# Patient Record
Sex: Female | Born: 1945 | Race: White | Hispanic: No | Marital: Married | Smoking: Current every day smoker
Health system: Southern US, Community
[De-identification: ages and names within clinical notes are randomized; demographics above are authoritative.]

## PROBLEM LIST (undated history)

## (undated) DIAGNOSIS — T8859XA Other complications of anesthesia, initial encounter: Secondary | ICD-10-CM

## (undated) DIAGNOSIS — E039 Hypothyroidism, unspecified: Secondary | ICD-10-CM

## (undated) DIAGNOSIS — T4145XA Adverse effect of unspecified anesthetic, initial encounter: Secondary | ICD-10-CM

## (undated) DIAGNOSIS — Z889 Allergy status to unspecified drugs, medicaments and biological substances status: Secondary | ICD-10-CM

## (undated) DIAGNOSIS — Z9889 Other specified postprocedural states: Secondary | ICD-10-CM

## (undated) DIAGNOSIS — H269 Unspecified cataract: Secondary | ICD-10-CM

## (undated) DIAGNOSIS — R112 Nausea with vomiting, unspecified: Secondary | ICD-10-CM

## (undated) DIAGNOSIS — D649 Anemia, unspecified: Secondary | ICD-10-CM

## (undated) HISTORY — PX: OTHER SURGICAL HISTORY: SHX169

## (undated) HISTORY — PX: SEPTOPLASTY: SUR1290

## (undated) HISTORY — PX: TUBAL LIGATION: SHX77

---

## 1998-07-28 ENCOUNTER — Other Ambulatory Visit: Admission: RE | Admit: 1998-07-28 | Discharge: 1998-07-28 | Payer: Self-pay | Admitting: *Deleted

## 2004-02-23 ENCOUNTER — Ambulatory Visit (HOSPITAL_BASED_OUTPATIENT_CLINIC_OR_DEPARTMENT_OTHER): Admission: RE | Admit: 2004-02-23 | Discharge: 2004-02-23 | Payer: Self-pay | Admitting: Orthopedic Surgery

## 2004-02-23 ENCOUNTER — Ambulatory Visit (HOSPITAL_COMMUNITY): Admission: RE | Admit: 2004-02-23 | Discharge: 2004-02-23 | Payer: Self-pay | Admitting: Orthopedic Surgery

## 2004-07-31 ENCOUNTER — Encounter (INDEPENDENT_AMBULATORY_CARE_PROVIDER_SITE_OTHER): Payer: Self-pay | Admitting: Specialist

## 2004-07-31 ENCOUNTER — Ambulatory Visit (HOSPITAL_COMMUNITY): Admission: RE | Admit: 2004-07-31 | Discharge: 2004-07-31 | Payer: Self-pay | Admitting: Gastroenterology

## 2004-09-28 ENCOUNTER — Other Ambulatory Visit: Admission: RE | Admit: 2004-09-28 | Discharge: 2004-09-28 | Payer: Self-pay | Admitting: Internal Medicine

## 2006-05-23 ENCOUNTER — Other Ambulatory Visit: Admission: RE | Admit: 2006-05-23 | Discharge: 2006-05-23 | Payer: Self-pay | Admitting: Internal Medicine

## 2006-05-24 ENCOUNTER — Encounter: Admission: RE | Admit: 2006-05-24 | Discharge: 2006-05-24 | Payer: Self-pay | Admitting: Internal Medicine

## 2006-08-24 IMAGING — CR DG CHEST 2V
2 series · 2 of 2 positions shown · non-contrast
Comparison: none

CLINICAL DATA: Cough. 
 TWO VIEW CHEST: 
 No comparison.  Peribronchial cuffing.  Negative for infiltrates, edema, or effusions.  Negative for pulmonary nodules.  Mediastinum and hila are negative for adenopathy.  Heart is normal size.  Degenerate changes mid thoracic spine.

[view not recorded (1 of 2)]
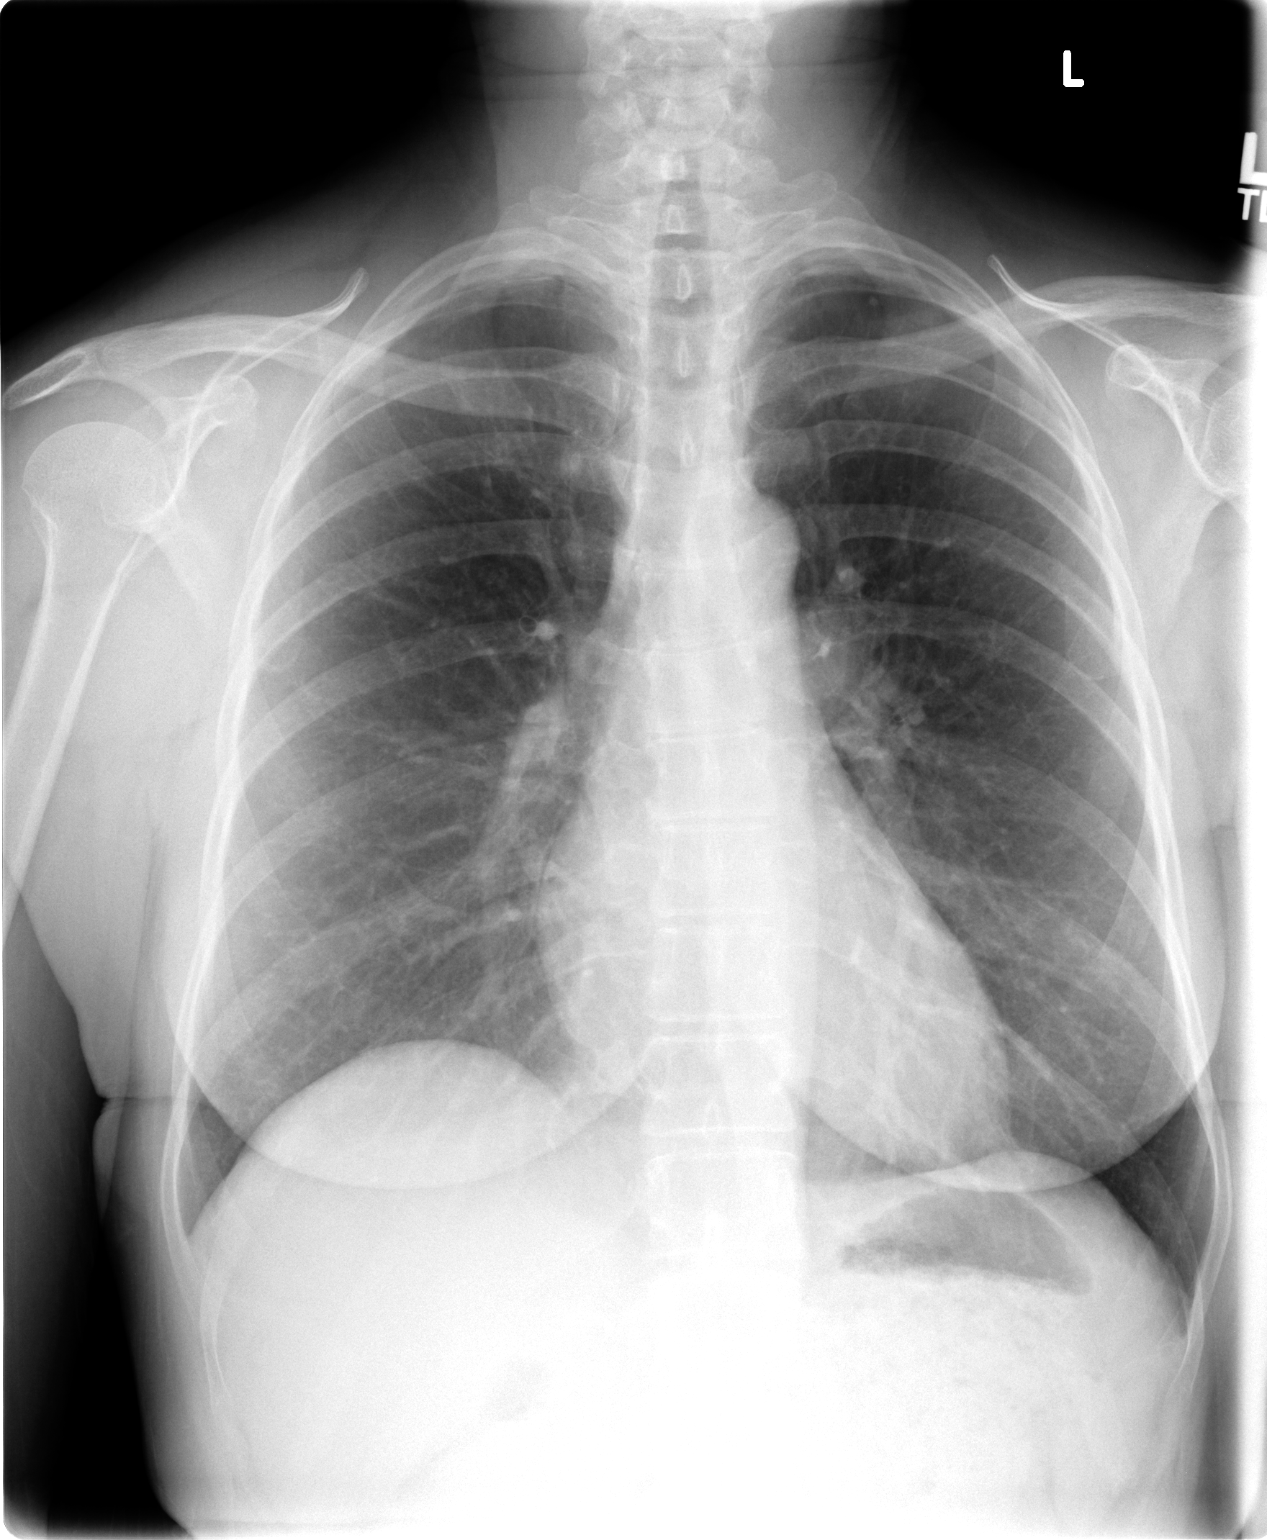

[view not recorded (2 of 2)]
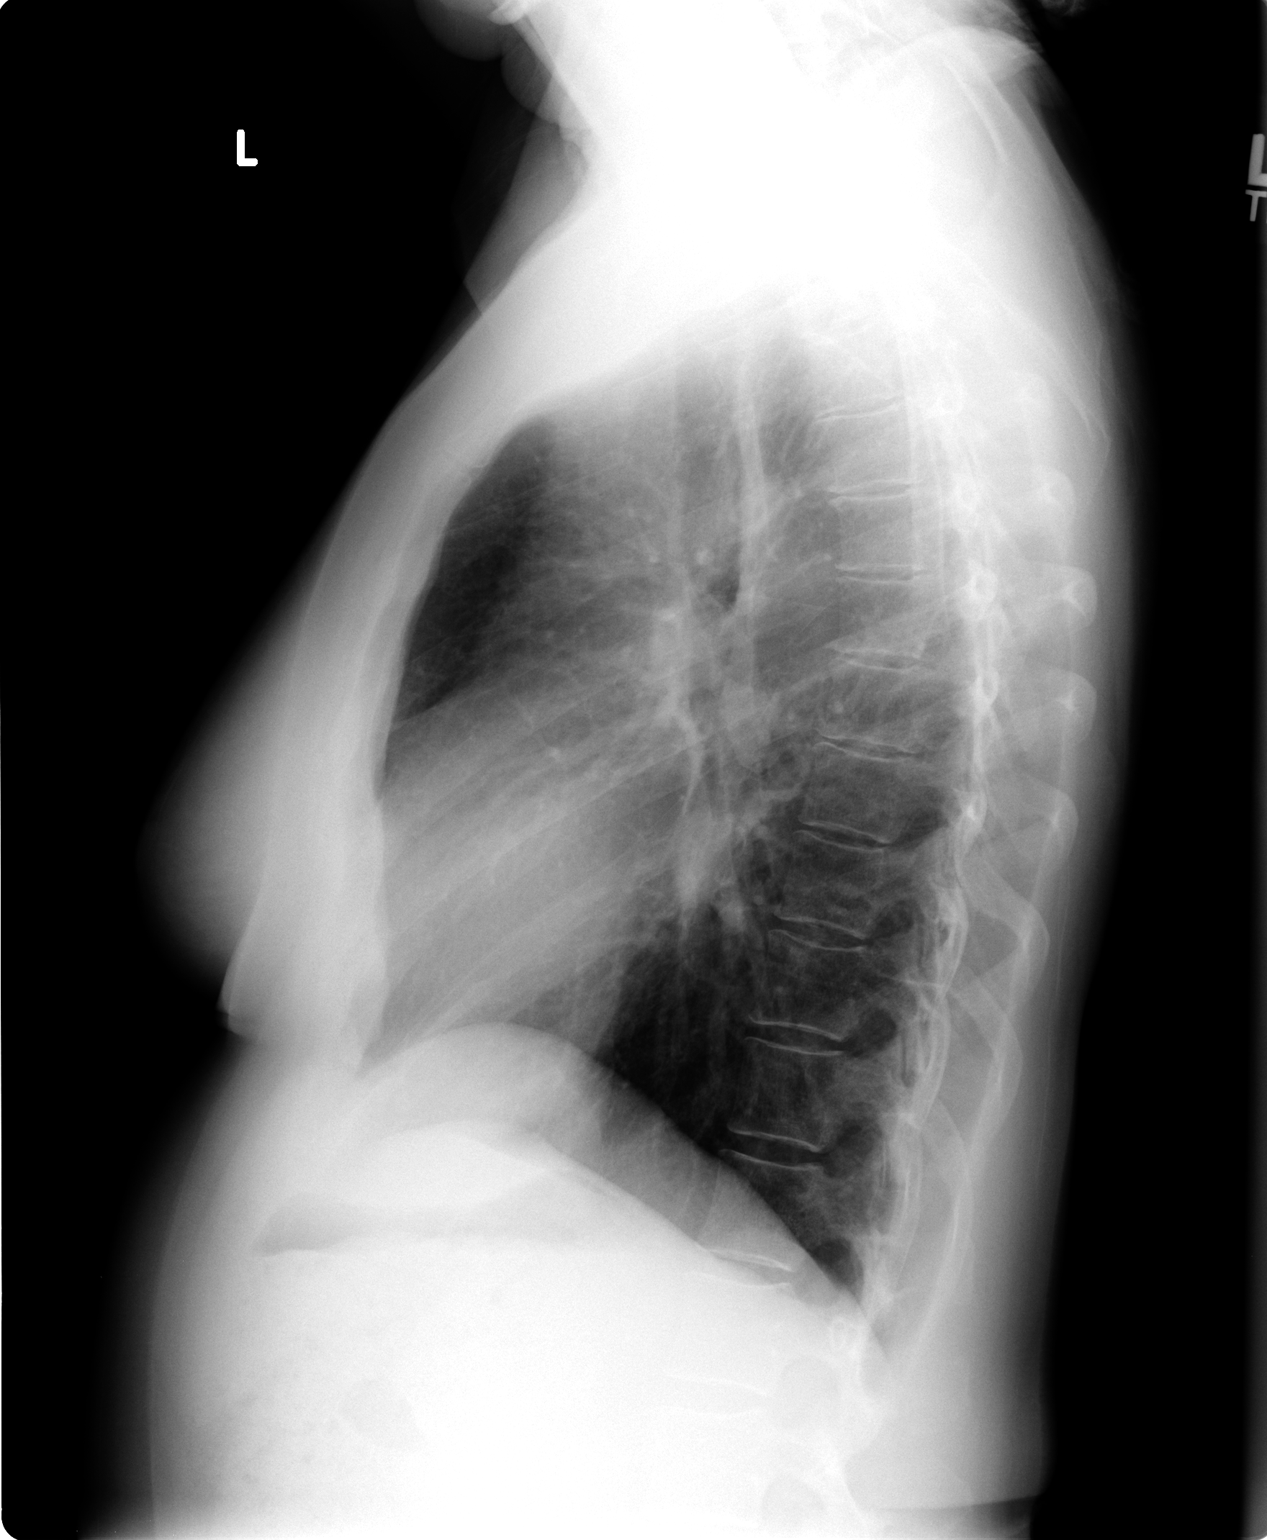

[2 of 2 positions shown; findings below may reference images not displayed]

IMPRESSION: Negative for acute cardiopulmonary process.

## 2007-10-23 ENCOUNTER — Other Ambulatory Visit: Admission: RE | Admit: 2007-10-23 | Discharge: 2007-10-23 | Payer: Self-pay | Admitting: Family Medicine

## 2008-11-04 ENCOUNTER — Other Ambulatory Visit: Admission: RE | Admit: 2008-11-04 | Discharge: 2008-11-04 | Payer: Self-pay | Admitting: Family Medicine

## 2009-11-10 ENCOUNTER — Other Ambulatory Visit: Admission: RE | Admit: 2009-11-10 | Discharge: 2009-11-10 | Payer: Self-pay | Admitting: Family Medicine

## 2011-01-11 ENCOUNTER — Other Ambulatory Visit: Payer: Self-pay | Admitting: Family Medicine

## 2011-01-11 ENCOUNTER — Other Ambulatory Visit (HOSPITAL_COMMUNITY)
Admission: RE | Admit: 2011-01-11 | Discharge: 2011-01-11 | Disposition: A | Discharge status: Home / Self Care | Payer: BC Managed Care – PPO | Source: Ambulatory Visit | Attending: Family Medicine | Admitting: Family Medicine

## 2011-01-11 DIAGNOSIS — Z124 Encounter for screening for malignant neoplasm of cervix: Secondary | ICD-10-CM | POA: Insufficient documentation

## 2011-04-20 NOTE — Op Note (Signed)
Kendra Mitchell, Kendra Mitchell                            ACCOUNT NO.:  [id number]   MEDICAL RECORD NO.:  [id number]                   PATIENT TYPE:  AMB   LOCATION:  DSC                                  FACILITY:  [facility]   PHYSICIAN:  Artist Pais. Mina Marble, M.D.           DATE OF BIRTH:  12-06-1945   DATE OF PROCEDURE:  02/23/2004  DATE OF DISCHARGE:                                 OPERATIVE REPORT   PREOPERATIVE DIAGNOSIS:  Left thumb stenosing tenosynovitis.   POSTOPERATIVE DIAGNOSIS:  Left thumb stenosing tenosynovitis.   PROCEDURE:  Release of left thumb A1 pulley.   SURGEON:  Artist Pais. Mina Marble, M.D.   ASSISTANT:  R.N.   ANESTHESIA:  Monitored anesthesia care with a combination of 2% plain  lidocaine, 0.25% Marcaine digital block 3 mL.   TOURNIQUET TIME:  12 minutes.   COMPLICATIONS:  None.   DRAINS:  None.   DESCRIPTION OF PROCEDURE:  The patient was taken to the operating room after  the induction of IV sedation.  A solution of 0.25% plain Marcaine and 2%  plain lidocaine was injected in the MP flexion crease, 3 mL, left thumb.  The hand was exsanguinated with an Esmarch and the tourniquet was inflated  to 250 mmHg.  At this point in time, a transverse incision was made to the  MP flexion crease 1.5 cm in length.  The incision was taken down through the  skin and subcutaneous tissues.  Neurovascular bundles were identified and  retracted.  The A1 pulley was split with a 15-blade.  The FPL tendon was  lysed of all adhesions.  It was irrigated and loosely closed with 5-0 nylon.  A sterile dressing of Xeroform, 4 x 4's, and Coban wrap was applied.  The  patient tolerated the procedure well and went to the recovery room in a  stable fashion.                                               Artist Pais Mina Marble, M.D.    MAW/MEDQ  D:  02/23/2004  T:  02/23/2004  Job:  [id number]

## 2011-04-20 NOTE — Op Note (Signed)
NAMEJAYLAA, GALLION                      ACCOUNT NO.:  [id number]   MEDICAL RECORD NO.:  [id number]                   PATIENT TYPE:  AMB   LOCATION:  ENDO                                 FACILITY:  [facility]   PHYSICIAN:  Danise Edge, M.D.                DATE OF BIRTH:  07-05-1946   DATE OF PROCEDURE:  07/31/2004  DATE OF DISCHARGE:                                 OPERATIVE REPORT   PROCEDURE:  Screening colonoscopy with polypectomy.   INDICATIONS:  Ms. Shalece Staffa is a 65 year old female born 1946/05/02.  Ms. Dahlem is scheduled to undergo her first screening colonoscopy  with polypectomy to prevent colon cancer.   ENDOSCOPIST:  Danise Edge, M.D.   PREMEDICATION:  Versed 5 mg, Demerol 50 mg.   PROCEDURE:  After obtaining informed consent, Ms. Lottman was placed in the  left lateral decubitus position.  I administered intravenous Demerol and  intravenous Versed to achieve conscious sedation for the procedure.  The  patient's blood pressure, oxygen saturation, and cardiac rhythm were  monitored throughout the procedure and documented in the medical record.   Anal inspection and digital rectal exam was normal.  The Olympus adjustable  pediatric colonoscope was introduced into the rectum and advanced to the  cecum.  A normal-appearing ileocecal valve was intubated, and the distal  ileum inspected.  Colonic preparation for the exam today was excellent.   RECTUM:  Normal.   SIGMOID COLON/DESCENDING COLON:  Left colonic diverticulosis.   SPLENIC FLEXURE:  Normal.   TRANSVERSE COLON:  Normal.   HEPATIC FLEXURE:  A 2 mm sessile polyp was removed with an electrocautery  snare.   ASCENDING COLON:  Normal.   CECUM AND ILEOCECAL VALVE:  From the proximal cecum, a 1 mm sessile polyp  was removed with cold biopsy forceps.   ASSESSMENT:  A diminutive polyp was removed from the cecum and a small polyp  was removed from the hepatic flexure.  Left colonic  diverticulosis present.                                               Danise Edge, M.D.    MJ/MEDQ  D:  07/31/2004  T:  07/31/2004  Job:  [id number]   cc:   Darius Bump, M.D.  [phone] [address]  [place] [address]  Fax: [phone]

## 2012-02-28 ENCOUNTER — Other Ambulatory Visit: Payer: Self-pay | Admitting: Family Medicine

## 2012-02-28 DIAGNOSIS — R0989 Other specified symptoms and signs involving the circulatory and respiratory systems: Secondary | ICD-10-CM

## 2012-03-03 ENCOUNTER — Ambulatory Visit
Admission: RE | Admit: 2012-03-03 | Discharge: 2012-03-03 | Disposition: A | Discharge status: Home / Self Care | Payer: Medicare Other | Source: Ambulatory Visit | Attending: Family Medicine | Admitting: Family Medicine

## 2012-03-03 DIAGNOSIS — R0989 Other specified symptoms and signs involving the circulatory and respiratory systems: Secondary | ICD-10-CM

## 2012-03-03 IMAGING — US US CAROTID DUPLEX BILAT
1 series · 13 of 24 positions shown · non-contrast
Comparison: None.

CLINICAL DATA: Left bruit

BILATERAL CAROTID DUPLEX ULTRASOUND
TECHNIQUE: Gray scale imaging, color Doppler and duplex ultrasound
was performed of bilateral carotid and vertebral arteries in the
neck.

[Series 1: us carotid duplex bilat · 0.07mm/px · 13 of 54 slices shown]
[im 1/54]
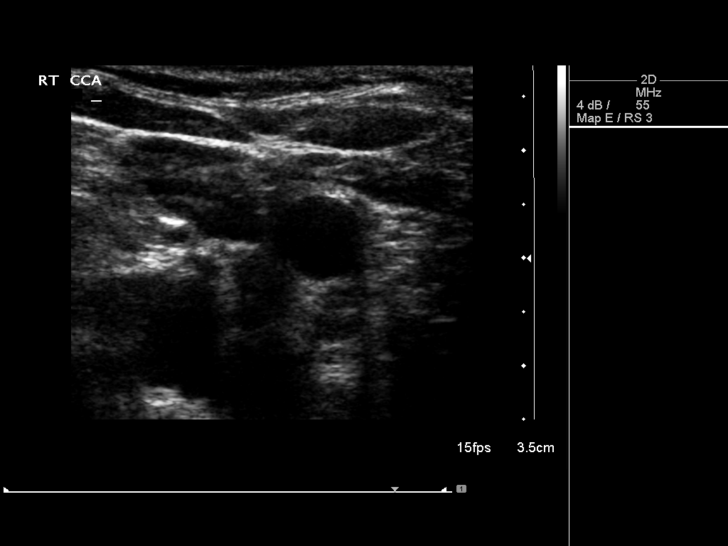
[im 5/54]
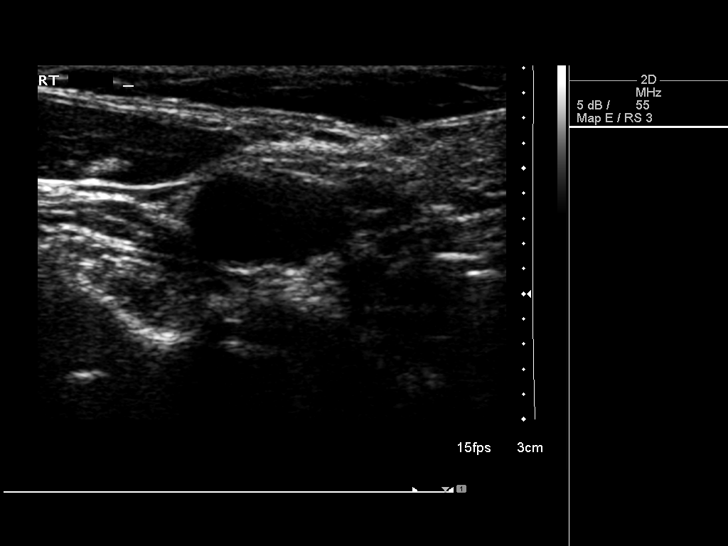
[im 10/54]
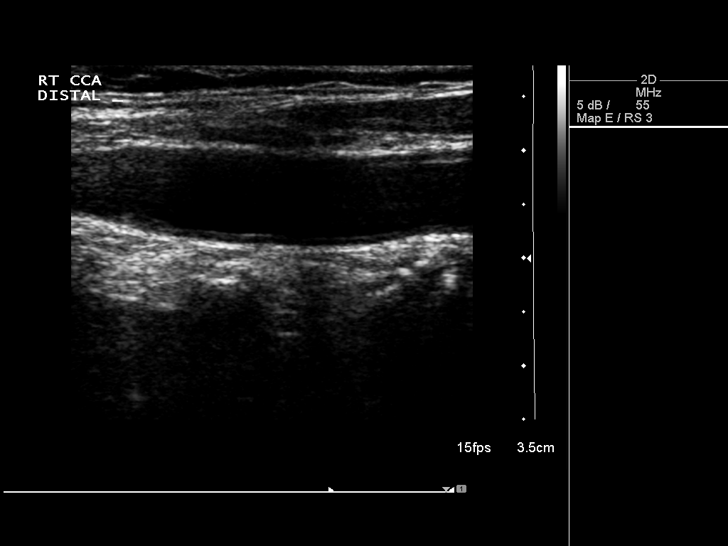
[im 14/54]
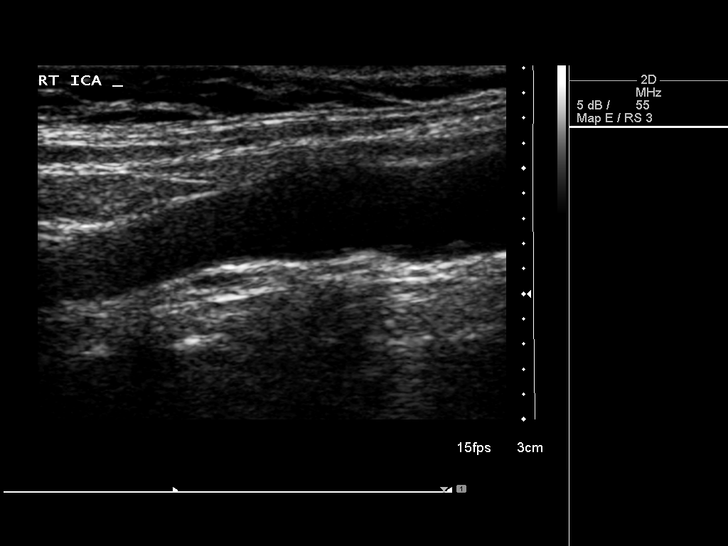
[im 19/54]
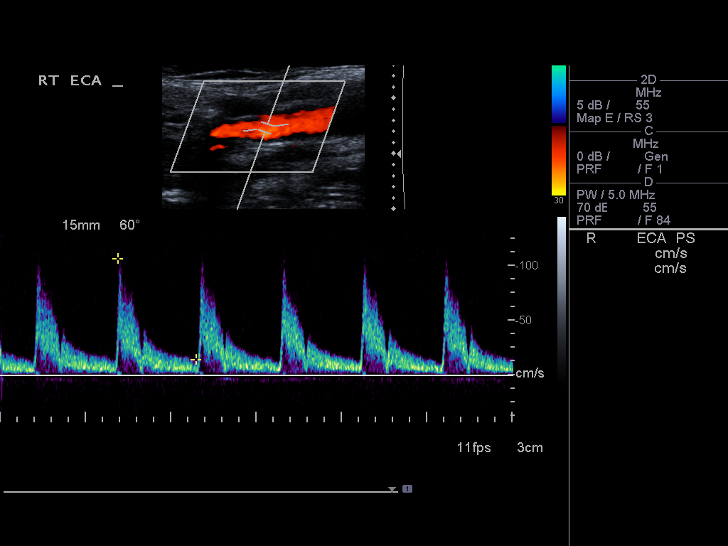
[im 24/54]
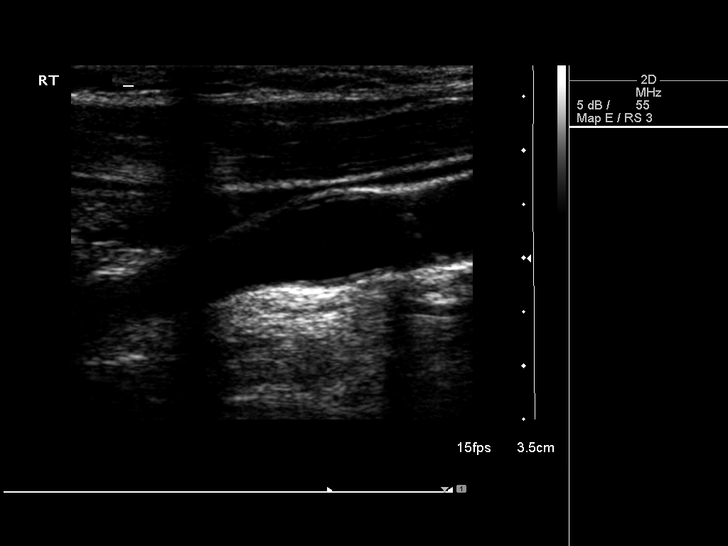
[im 28/54]
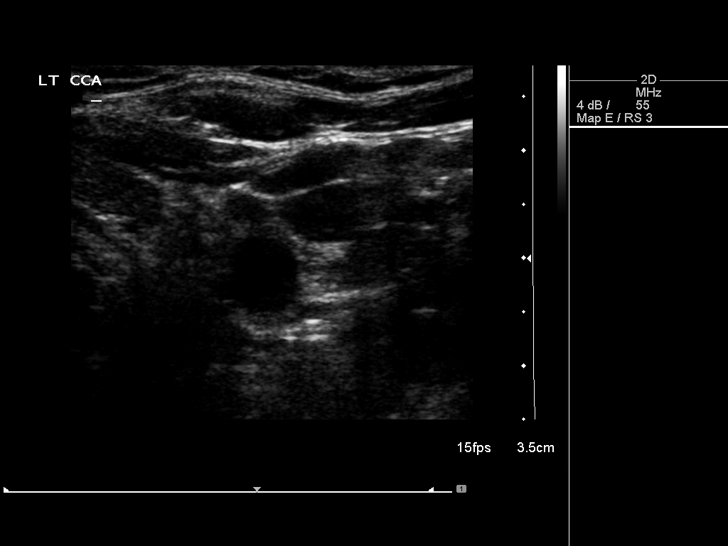
[im 30/54]
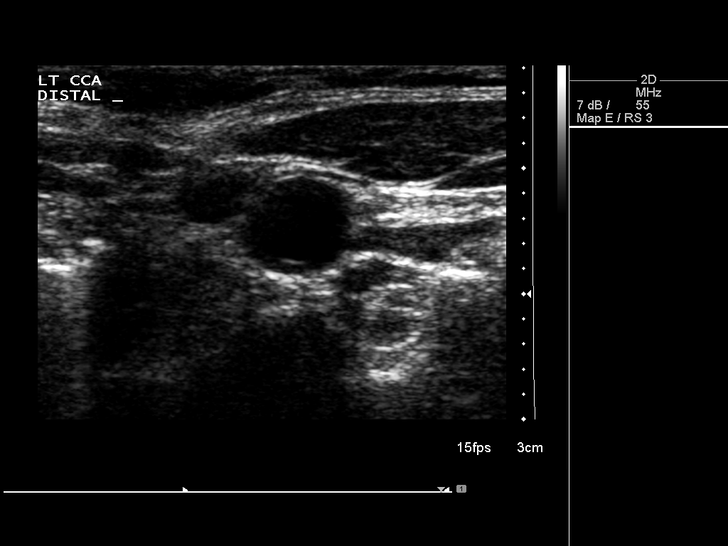
[im 35/54]
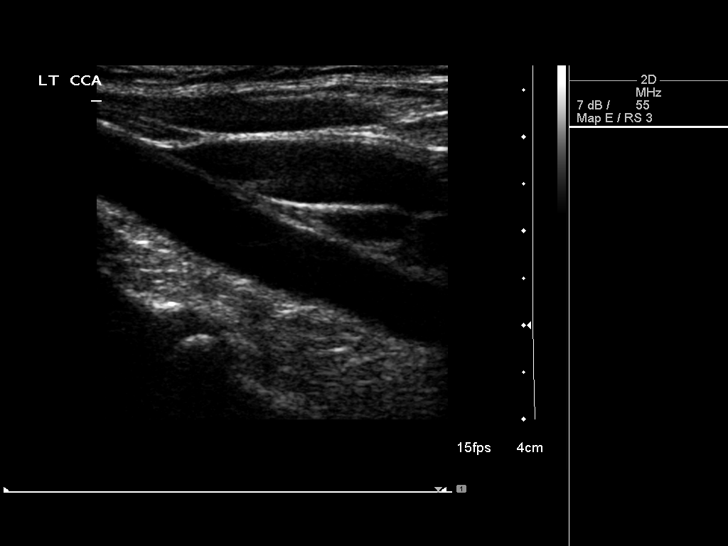
[im 40/54]
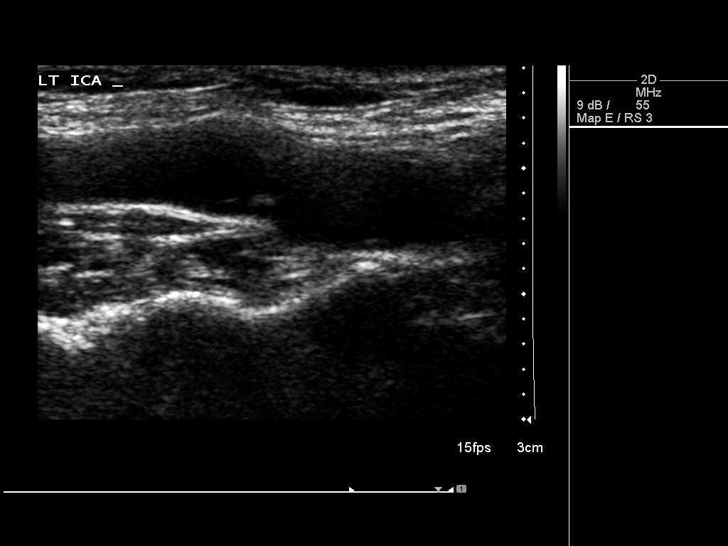
[im 44/54]
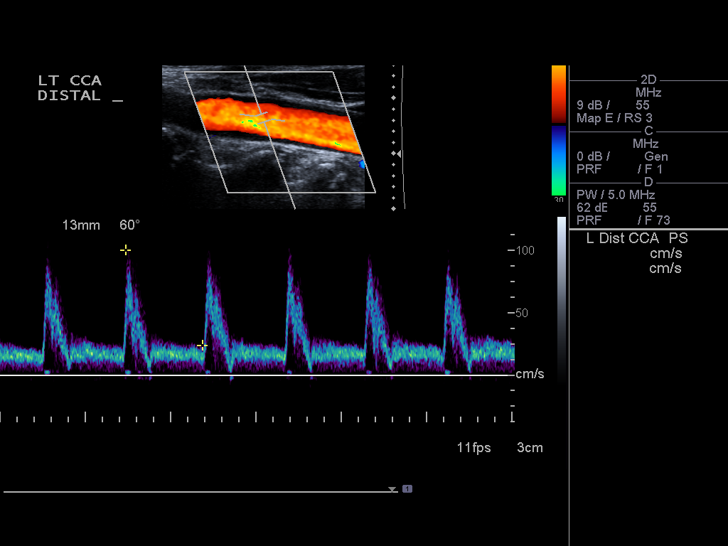
[im 49/54]
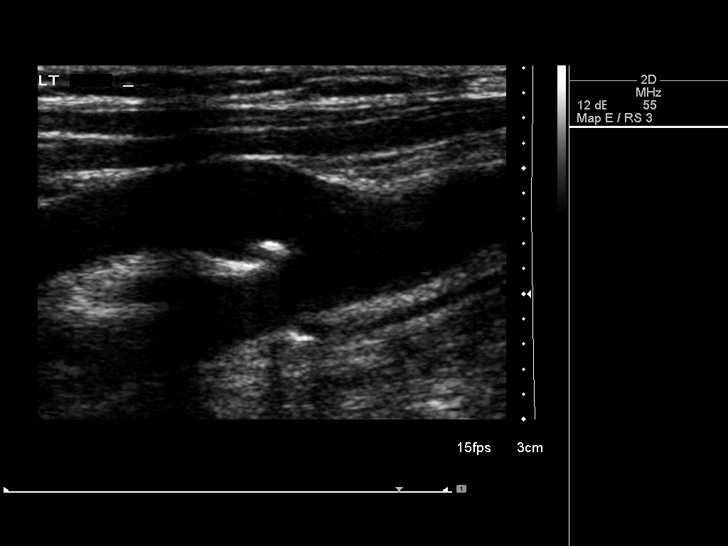
[im 54/54]
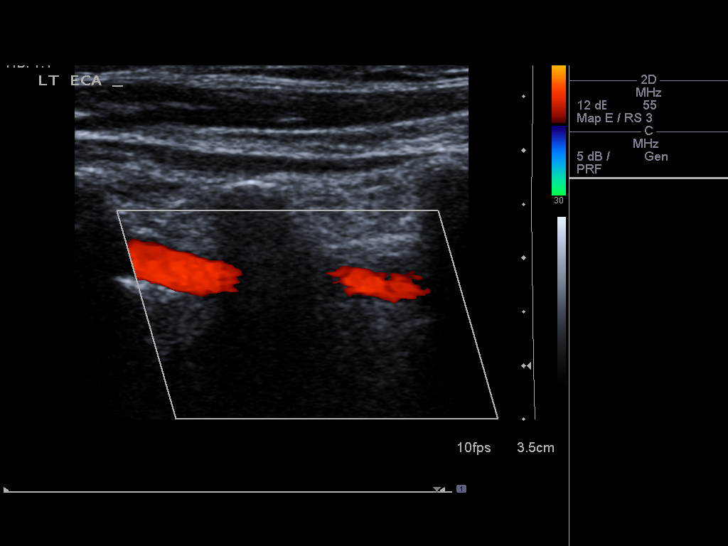

[13 of 24 positions shown; findings below may reference images not displayed]

Criteria:  Quantification of carotid stenosis is based on velocity
parameters that correlate the residual internal carotid diameter
with NASCET-based stenosis levels, using the diameter of the distal
internal carotid lumen as the denominator for stenosis measurement.

The following velocity measurements were obtained:

                 PEAK SYSTOLIC/END DIASTOLIC
RIGHT
ICA:                        91.7cm/sec
CCA:                        100.9cm/sec
SYSTOLIC ICA/CCA RATIO:
DIASTOLIC ICA/CCA RATIO:
ECA:                        106.8cm/sec

LEFT
ICA:                        123.5cm/sec
CCA:                        107.4cm/sec
SYSTOLIC ICA/CCA RATIO:
DIASTOLIC ICA/CCA RATIO:
ECA:                        104.8cm/sec
FINDINGS: RIGHT CAROTID ARTERY: Mild calcified plaque in the bulb.  Normal-
appearing low resistance internal carotid Doppler wave form.

RIGHT VERTEBRAL ARTERY:  Antegrade.

LEFT CAROTID ARTERY: Mild calcified plaque in the bulb.  Low
resistance internal carotid Doppler wave form.

LEFT VERTEBRAL ARTERY:  Antegrade.
IMPRESSION: Less than 50% stenosis in the right and left internal carotid
arteries.  Mild plaque in both bulbs.

## 2013-03-12 ENCOUNTER — Other Ambulatory Visit (HOSPITAL_COMMUNITY)
Admission: RE | Admit: 2013-03-12 | Discharge: 2013-03-12 | Disposition: A | Discharge status: Home / Self Care | Payer: Medicare Other | Source: Ambulatory Visit | Attending: Family Medicine | Admitting: Family Medicine

## 2013-03-12 ENCOUNTER — Other Ambulatory Visit: Payer: Self-pay | Admitting: Family Medicine

## 2013-03-12 DIAGNOSIS — Z124 Encounter for screening for malignant neoplasm of cervix: Secondary | ICD-10-CM | POA: Insufficient documentation

## 2015-09-27 ENCOUNTER — Other Ambulatory Visit: Payer: Self-pay | Admitting: Gastroenterology

## 2015-11-29 ENCOUNTER — Encounter (HOSPITAL_COMMUNITY): Payer: Self-pay | Admitting: *Deleted

## 2015-12-12 ENCOUNTER — Encounter (HOSPITAL_COMMUNITY)
Admission: RE | Disposition: A | Discharge status: Home / Self Care | Payer: Self-pay | Source: Ambulatory Visit | Attending: Gastroenterology

## 2015-12-12 ENCOUNTER — Ambulatory Visit (HOSPITAL_COMMUNITY)
Admission: RE | Admit: 2015-12-12 | Discharge: 2015-12-13 | Disposition: A | Discharge status: Home / Self Care | Payer: Medicare Other | Source: Ambulatory Visit | Attending: Gastroenterology | Admitting: Gastroenterology

## 2015-12-12 ENCOUNTER — Ambulatory Visit (HOSPITAL_COMMUNITY): Payer: Medicare Other | Admitting: Anesthesiology

## 2015-12-12 ENCOUNTER — Encounter (HOSPITAL_COMMUNITY): Payer: Self-pay

## 2015-12-12 DIAGNOSIS — E7800 Pure hypercholesterolemia, unspecified: Secondary | ICD-10-CM | POA: Diagnosis not present

## 2015-12-12 DIAGNOSIS — Z1211 Encounter for screening for malignant neoplasm of colon: Secondary | ICD-10-CM | POA: Insufficient documentation

## 2015-12-12 DIAGNOSIS — Z86010 Personal history of colonic polyps: Secondary | ICD-10-CM | POA: Insufficient documentation

## 2015-12-12 DIAGNOSIS — E039 Hypothyroidism, unspecified: Secondary | ICD-10-CM | POA: Diagnosis not present

## 2015-12-12 HISTORY — DX: Allergy status to unspecified drugs, medicaments and biological substances: Z88.9

## 2015-12-12 HISTORY — DX: Other complications of anesthesia, initial encounter: T88.59XA

## 2015-12-12 HISTORY — DX: Anemia, unspecified: D64.9

## 2015-12-12 HISTORY — DX: Adverse effect of unspecified anesthetic, initial encounter: T41.45XA

## 2015-12-12 HISTORY — PX: COLONOSCOPY WITH PROPOFOL: SHX5780

## 2015-12-12 HISTORY — DX: Nausea with vomiting, unspecified: R11.2

## 2015-12-12 HISTORY — DX: Hypothyroidism, unspecified: E03.9

## 2015-12-12 HISTORY — DX: Unspecified cataract: H26.9

## 2015-12-12 HISTORY — DX: Other specified postprocedural states: Z98.890

## 2015-12-12 SURGERY — COLONOSCOPY WITH PROPOFOL
Anesthesia: Monitor Anesthesia Care

## 2015-12-12 MED ORDER — SODIUM CHLORIDE 0.9 % IV SOLN: INTRAVENOUS | Status: DC

## 2015-12-12 MED ORDER — GLYCOPYRROLATE 0.2 MG/ML IJ SOLN
INTRAMUSCULAR | Status: DC | PRN
  Administered 2015-12-12: 0.2 mg via INTRAVENOUS

## 2015-12-12 MED ORDER — GLYCOPYRROLATE 0.2 MG/ML IJ SOLN
INTRAMUSCULAR | Status: AC
  Filled 2015-12-12: qty 1

## 2015-12-12 MED ORDER — PROPOFOL 500 MG/50ML IV EMUL
INTRAVENOUS | Status: DC | PRN
  Administered 2015-12-12: 50 mg via INTRAVENOUS

## 2015-12-12 MED ORDER — SCOPOLAMINE 1 MG/3DAYS TD PT72
1.0000 | MEDICATED_PATCH | TRANSDERMAL | Status: DC
  Administered 2015-12-12: 1 via TRANSDERMAL
  Filled 2015-12-12: qty 1

## 2015-12-12 MED ORDER — PROPOFOL 500 MG/50ML IV EMUL
INTRAVENOUS | Status: DC | PRN
  Administered 2015-12-12: 120 ug/kg/min via INTRAVENOUS

## 2015-12-12 MED ORDER — LACTATED RINGERS IV SOLN
INTRAVENOUS | Status: DC
  Administered 2015-12-12: 09:00:00 via INTRAVENOUS

## 2015-12-12 MED ORDER — PROPOFOL 10 MG/ML IV BOLUS
INTRAVENOUS | Status: AC
  Filled 2015-12-12: qty 40

## 2015-12-12 MED ORDER — ONDANSETRON HCL 4 MG/2ML IJ SOLN
4.0000 mg | Freq: Once | INTRAMUSCULAR | Status: AC
  Administered 2015-12-12: 4 mg via INTRAVENOUS

## 2015-12-12 MED ORDER — ONDANSETRON HCL 4 MG/2ML IJ SOLN
INTRAMUSCULAR | Status: AC
  Filled 2015-12-12: qty 2

## 2015-12-12 MED ORDER — LIDOCAINE HCL (CARDIAC) 20 MG/ML IV SOLN
INTRAVENOUS | Status: AC
  Filled 2015-12-12: qty 5

## 2015-12-12 SURGICAL SUPPLY — 22 items

## 2015-12-12 NOTE — Anesthesia Postprocedure Evaluation (Signed)
Anesthesia Post Note  Patient: Kendra Mitchell  Procedure(s) Performed: Procedure(s) (LRB): COLONOSCOPY WITH PROPOFOL (N/A)  Patient location during evaluation: PACU Anesthesia Type: MAC Level of consciousness: awake and alert Pain management: pain level controlled Vital Signs Assessment: post-procedure vital signs reviewed and stable Respiratory status: spontaneous breathing, nonlabored ventilation, respiratory function stable and patient connected to nasal cannula oxygen Cardiovascular status: stable and blood pressure returned to baseline Anesthetic complications: no    Last Vitals:  Filed Vitals:   12/12/15 1027 12/12/15 1042  BP: 134/60 164/64  Pulse:  62  Temp:    Resp:  15    Last Pain: There were no vitals filed for this visit.               Jiles GarterJACKSON,Cobin Cadavid EDWARD

## 2015-12-12 NOTE — Transfer of Care (Signed)
Immediate Anesthesia Transfer of Care Note  Patient: Kendra Mitchell  Procedure(s) Performed: Procedure(s): COLONOSCOPY WITH PROPOFOL (N/A)  Patient Location: PACU  Anesthesia Type:MAC  Level of Consciousness: awake, alert  and oriented  Airway & Oxygen Therapy: Patient Spontanous Breathing and Patient connected to face mask oxygen  Post-op Assessment: Report given to RN and Post -op Vital signs reviewed and stable  Post vital signs: Reviewed and stable  Last Vitals:  Filed Vitals:   12/12/15 0843  BP: 143/60  Pulse: 63  Temp: 37.1 C  Resp: 21    Complications: No apparent anesthesia complications

## 2015-12-12 NOTE — Op Note (Signed)
Procedure: Surveillance colonoscopy. One/31/2011 colonoscopy performed with removal of a small adenomatous colon polyp.  Endoscopist: Danise EdgeMartin Dolton Shaker  Premedication: Propofol administered by anesthesia  Procedure: The patient was placed in the left lateral decubitus position. Anal inspection and digital rectal exam were normal. The Pentax pediatric colonoscope was introduced into the rectum and advanced to the cecum. A normal-appearing appendiceal orifice and ileocecal valve were identified. Colonic preparation for the exam today was good. Withdrawal time was 11 minutes  Rectum. Normal. Retroflexed view of the distal rectum was normal  Sigmoid colon and descending colon. Normal  Splenic flexure. Normal  Transverse colon. Normal  Hepatic flexure. Normal  Ascending colon. Normal  Cecum and ileocecal valve. Normal  Assessment: Normal surveillance colonoscopy.  Recommendation: A repeat surveillance colonoscopy is probably not necessary.

## 2015-12-12 NOTE — H&P (Signed)
  Procedure: Surveillance colonoscopy. One/31/2011 colonoscopy performed with removal of a small adenomatous colon polyp  History: The patient is a 70 year old female born May 01, 1946. She is scheduled to undergo a surveillance colonoscopy today.  Past medical history: Osteoporosis of the lumbar spine. Hypercholesterolemia. 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 Hypothyroidism. Tonsillectomy. Bilateral tubal ligation. Deviated septum surgery.  Medication allergies: Sulfa drugs. Codeine.  Family history: Father diagnosed with colon cancer  Exam: The patient is alert and lying comfortably on the endoscopy stretcher. Abdomen is soft and nontender to palpation. Cardiac exam reveals a regular rhythm. Lungs are clear to auscultation.  Plan: Proceed with surveillance colonoscopy

## 2015-12-12 NOTE — Discharge Instructions (Signed)
Colonoscopy, Care After °Refer to this sheet in the next few weeks. These instructions provide you with information on caring for yourself after your procedure. Your health care provider may also give you more specific instructions. Your treatment has been planned according to current medical practices, but problems sometimes occur. Call your health care provider if you have any problems or questions after your procedure. °WHAT TO EXPECT AFTER THE PROCEDURE  °After your procedure, it is typical to have the following: °· A small amount of blood in your stool. °· Moderate amounts of gas and mild abdominal cramping or bloating. °HOME CARE INSTRUCTIONS °· Do not drive, operate machinery, or sign important documents for 24 hours. °· You may shower and resume your regular physical activities, but move at a slower pace for the first 24 hours. °· Take frequent rest periods for the first 24 hours. °· Walk around or put a warm pack on your abdomen to help reduce abdominal cramping and bloating. °· Drink enough fluids to keep your urine clear or pale yellow. °· You may resume your normal diet as instructed by your health care provider. Avoid heavy or fried foods that are hard to digest. °· Avoid drinking alcohol for 24 hours or as instructed by your health care provider. °· Only take over-the-counter or prescription medicines as directed by your health care provider. °· If a tissue sample (biopsy) was taken during your procedure: °¨ Do not take aspirin or blood thinners for 7 days, or as instructed by your health care provider. °¨ Do not drink alcohol for 7 days, or as instructed by your health care provider. °¨ Eat soft foods for the first 24 hours. °SEEK MEDICAL CARE IF: °You have persistent spotting of blood in your stool 2-3 days after the procedure. °SEEK IMMEDIATE MEDICAL CARE IF: °· You have more than a small spotting of blood in your stool. °· You pass large blood clots in your stool. °· Your abdomen is swollen  (distended). °· You have nausea or vomiting. °· You have a fever. °· You have increasing abdominal pain that is not relieved with medicine. °  °This information is not intended to replace advice given to you by your health care provider. Make sure you discuss any questions you have with your health care provider. °  °Document Released: 07/03/2004 Document Revised: 09/09/2013 Document Reviewed: 07/27/2013 °Elsevier Interactive Patient Education ©2016 [employer] ° °

## 2015-12-12 NOTE — Anesthesia Preprocedure Evaluation (Addendum)

## 2015-12-13 ENCOUNTER — Encounter (HOSPITAL_COMMUNITY): Payer: Self-pay | Admitting: Gastroenterology

## 2016-04-16 ENCOUNTER — Other Ambulatory Visit: Payer: Self-pay | Admitting: Family Medicine

## 2016-04-16 DIAGNOSIS — R0989 Other specified symptoms and signs involving the circulatory and respiratory systems: Secondary | ICD-10-CM

## 2016-05-22 ENCOUNTER — Ambulatory Visit
Admission: RE | Admit: 2016-05-22 | Discharge: 2016-05-22 | Disposition: A | Discharge status: Home / Self Care | Payer: Medicare Other | Source: Ambulatory Visit | Attending: Family Medicine | Admitting: Family Medicine

## 2016-05-22 DIAGNOSIS — R0989 Other specified symptoms and signs involving the circulatory and respiratory systems: Secondary | ICD-10-CM

## 2016-05-22 IMAGING — US US CAROTID DUPLEX BILAT
1 series · 13 of 24 positions shown · non-contrast
Comparison: [DATE]

CLINICAL DATA: 69-year-old female with a history of carotid bruit.

Cardiovascular risk factors include hyperlipidemia, tobacco use
EXAM:
BILATERAL CAROTID DUPLEX ULTRASOUND
TECHNIQUE: Gray scale imaging, color Doppler and duplex ultrasound were
performed of bilateral carotid and vertebral arteries in the neck.

[Series 1: us carotid duplex bilat · 0.08mm/px · 13 of 68 slices shown]
[im 1/68]
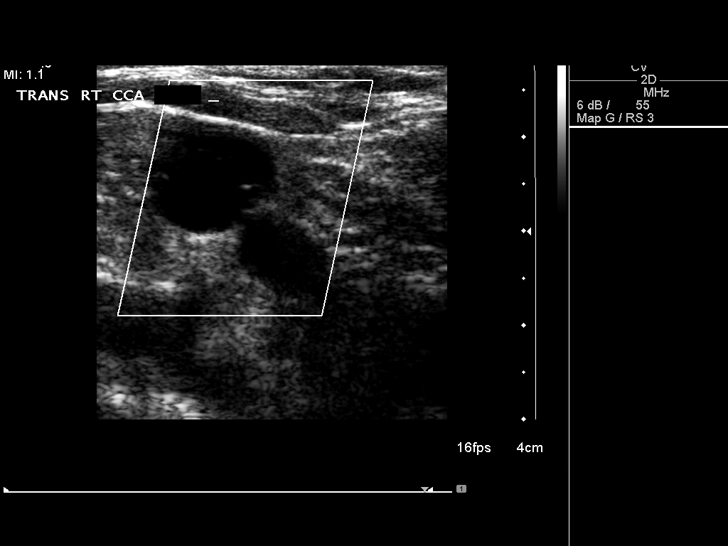
[im 6/68]
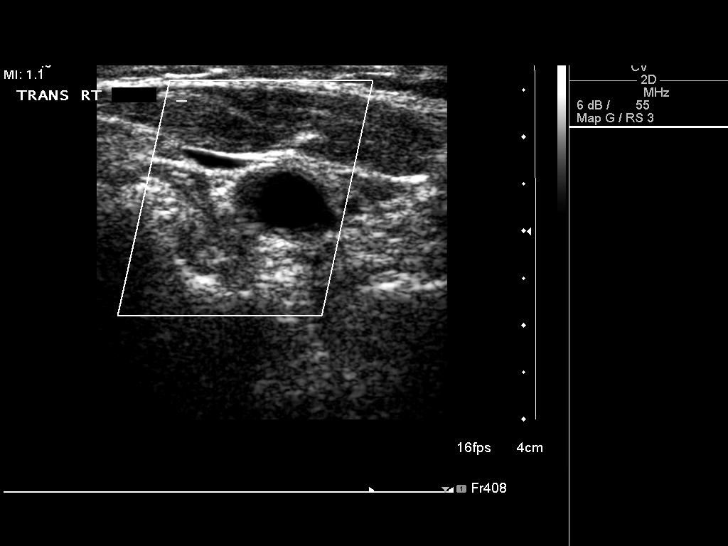
[im 12/68]
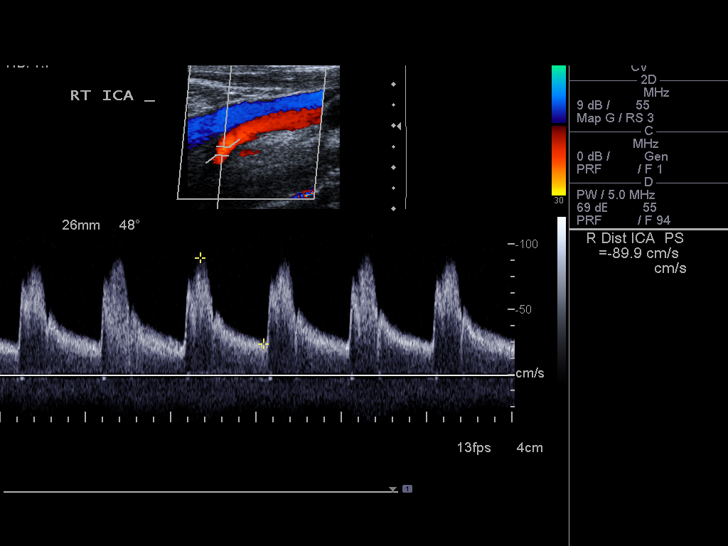
[im 18/68]
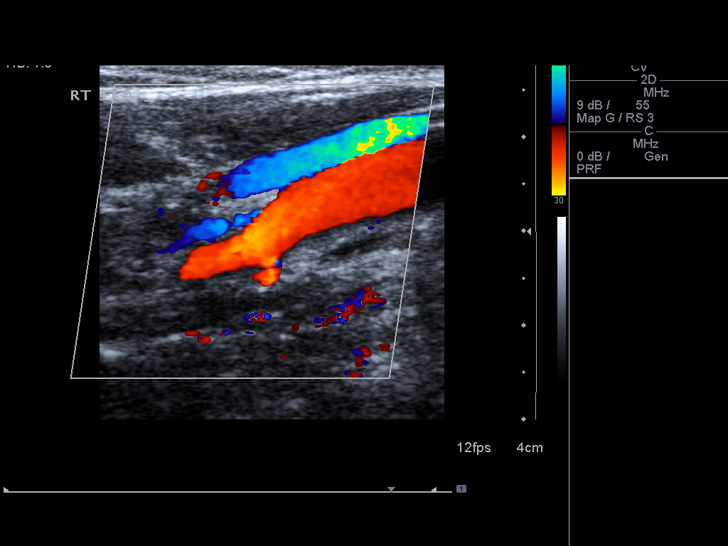
[im 24/68]
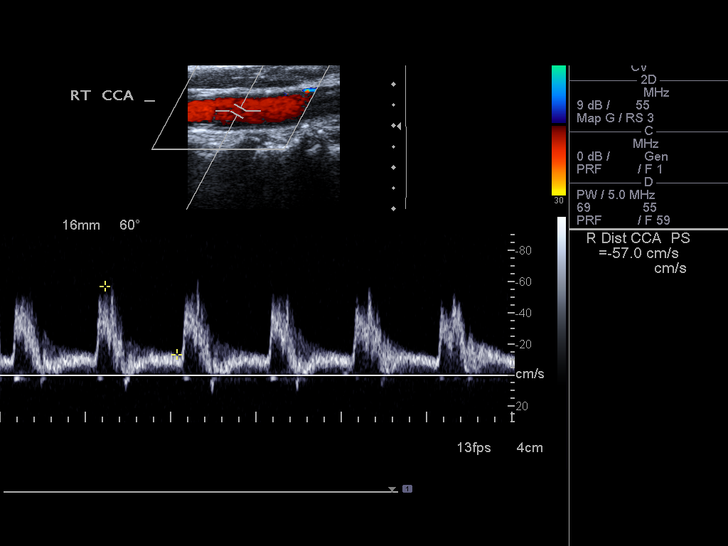
[im 30/68]
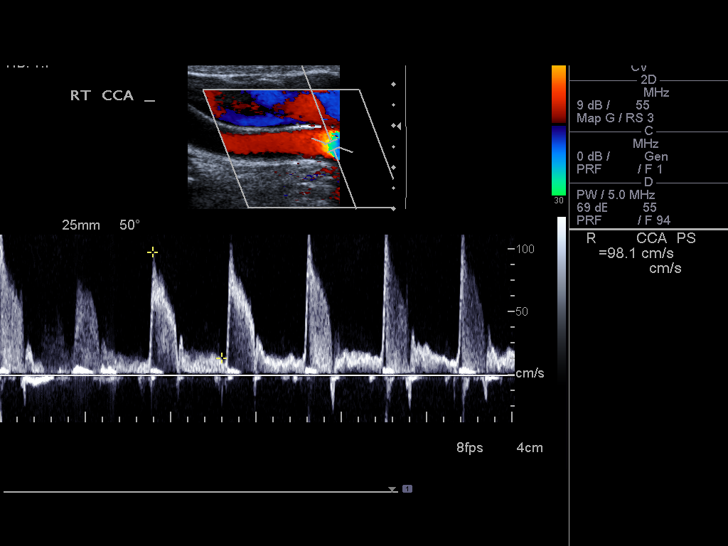
[im 35/68]
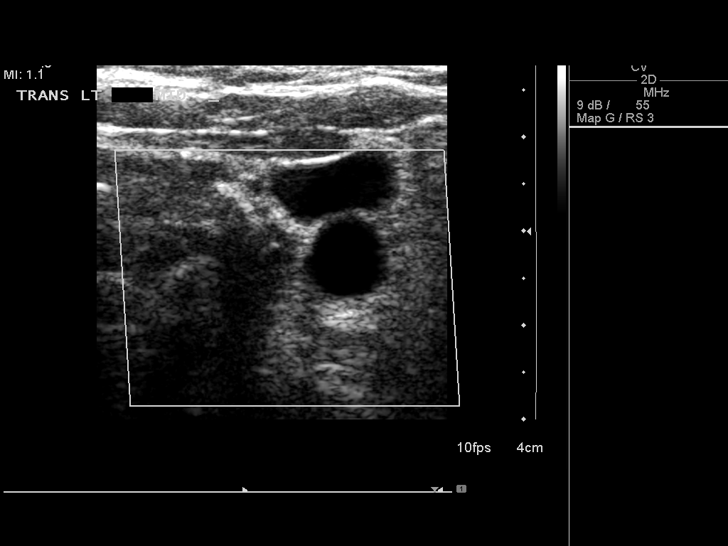
[im 38/68]
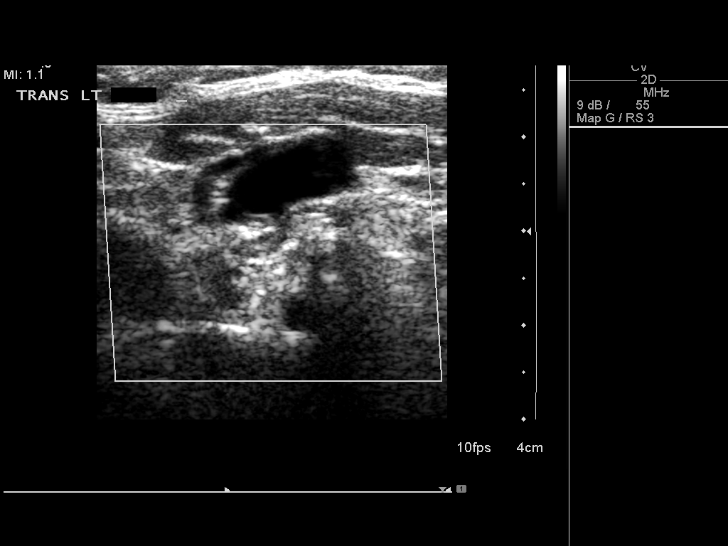
[im 44/68]
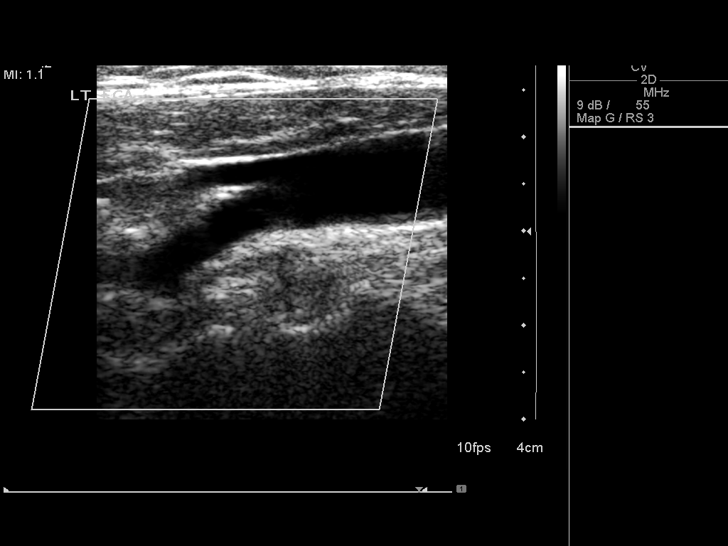
[im 50/68]
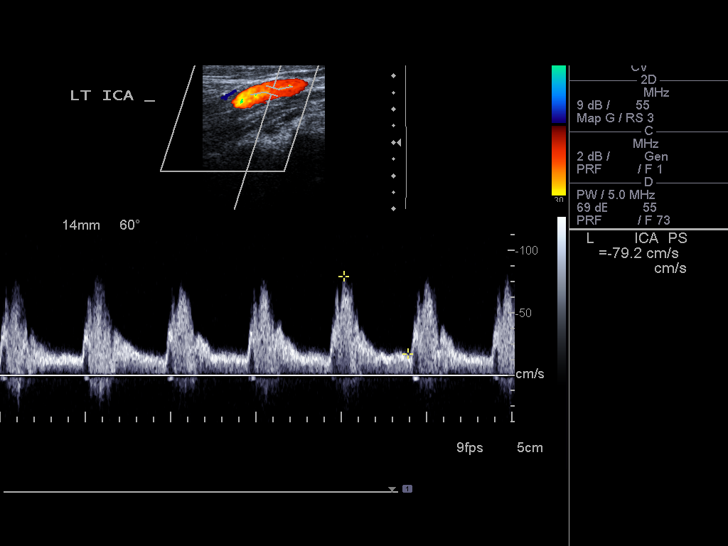
[im 56/68]
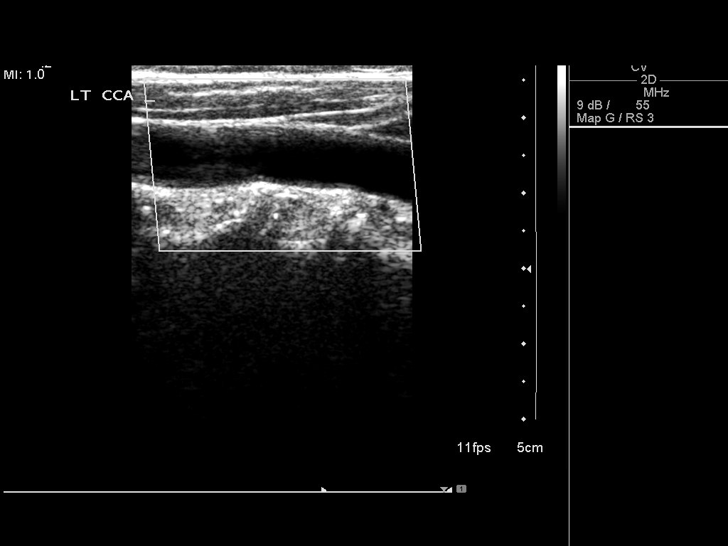
[im 62/68]
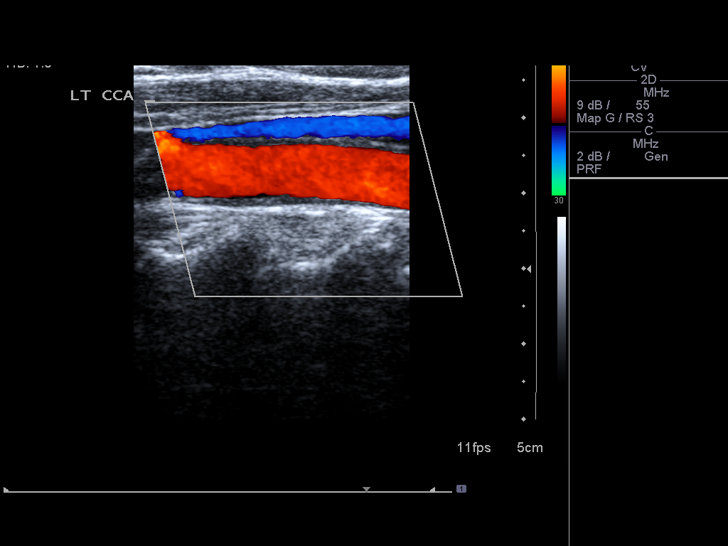
[im 68/68]
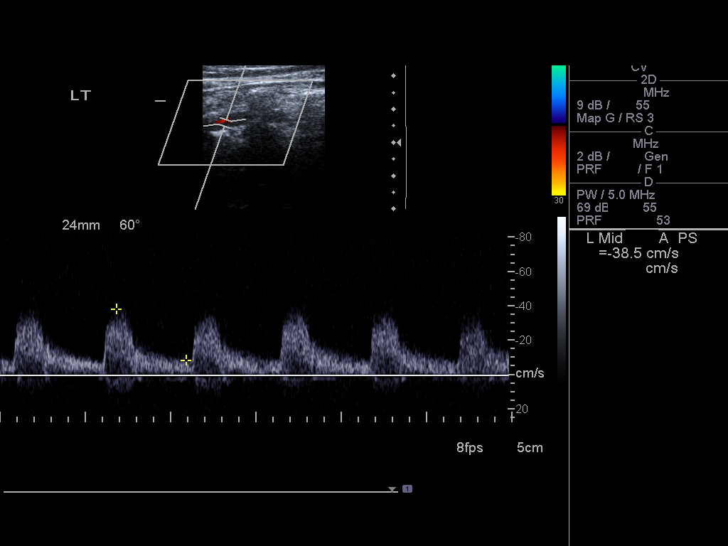

[13 of 24 positions shown; findings below may reference images not displayed]

FINDINGS: Criteria: Quantification of carotid stenosis is based on velocity
parameters that correlate the residual internal carotid diameter
with NASCET-based stenosis levels, using the diameter of the distal
internal carotid lumen as the denominator for stenosis measurement.

The following velocity measurements were obtained:

RIGHT

ICA:  Systolic 90 cm/sec, Diastolic 24 cm/sec

CCA:  98 cm/sec

SYSTOLIC ICA/CCA RATIO:

ECA:  69 cm/sec

LEFT

ICA:  Systolic 87 cm/sec, Diastolic 27 cm/sec

CCA:  114 cm/sec

SYSTOLIC ICA/CCA RATIO:

ECA:  101 cm/sec

Right Brachial SBP: 154

Left Brachial SBP: 149

RIGHT CAROTID ARTERY: No significant calcified disease of the right
common carotid artery. Intermediate waveform maintained.
Heterogeneous plaque without significant calcifications at the right
carotid bifurcation. Low resistance waveform of the right ICA. No
significant tortuosity.

RIGHT VERTEBRAL ARTERY: Antegrade flow with low resistance waveform.

LEFT CAROTID ARTERY: No significant calcified disease of the left
common carotid artery. Intermediate waveform maintained.
Heterogeneous plaque at the left carotid bifurcation without
significant calcifications. Low resistance waveform of the left ICA.

LEFT VERTEBRAL ARTERY:  Antegrade flow with low resistance waveform.
IMPRESSION: Color duplex indicates minimal heterogeneous plaque, with no
hemodynamically significant stenosis by duplex criteria in the
extracranial cerebrovascular circulation.

## 2017-02-26 DIAGNOSIS — Z1231 Encounter for screening mammogram for malignant neoplasm of breast: Secondary | ICD-10-CM | POA: Diagnosis not present

## 2017-04-18 DIAGNOSIS — E039 Hypothyroidism, unspecified: Secondary | ICD-10-CM | POA: Diagnosis not present

## 2017-04-18 DIAGNOSIS — R7301 Impaired fasting glucose: Secondary | ICD-10-CM | POA: Diagnosis not present

## 2017-04-18 DIAGNOSIS — E559 Vitamin D deficiency, unspecified: Secondary | ICD-10-CM | POA: Diagnosis not present

## 2017-04-18 DIAGNOSIS — E7800 Pure hypercholesterolemia, unspecified: Secondary | ICD-10-CM | POA: Diagnosis not present

## 2017-04-22 DIAGNOSIS — R0989 Other specified symptoms and signs involving the circulatory and respiratory systems: Secondary | ICD-10-CM | POA: Diagnosis not present

## 2017-04-22 DIAGNOSIS — R7301 Impaired fasting glucose: Secondary | ICD-10-CM | POA: Diagnosis not present

## 2017-04-22 DIAGNOSIS — E559 Vitamin D deficiency, unspecified: Secondary | ICD-10-CM | POA: Diagnosis not present

## 2017-04-22 DIAGNOSIS — Z0000 Encounter for general adult medical examination without abnormal findings: Secondary | ICD-10-CM | POA: Diagnosis not present

## 2017-04-22 DIAGNOSIS — M810 Age-related osteoporosis without current pathological fracture: Secondary | ICD-10-CM | POA: Diagnosis not present

## 2017-04-22 DIAGNOSIS — E7800 Pure hypercholesterolemia, unspecified: Secondary | ICD-10-CM | POA: Diagnosis not present

## 2017-04-22 DIAGNOSIS — N898 Other specified noninflammatory disorders of vagina: Secondary | ICD-10-CM | POA: Diagnosis not present

## 2017-04-22 DIAGNOSIS — E039 Hypothyroidism, unspecified: Secondary | ICD-10-CM | POA: Diagnosis not present

## 2017-04-22 DIAGNOSIS — Z1389 Encounter for screening for other disorder: Secondary | ICD-10-CM | POA: Diagnosis not present

## 2017-04-22 DIAGNOSIS — Z86010 Personal history of colonic polyps: Secondary | ICD-10-CM | POA: Diagnosis not present

## 2017-11-01 DIAGNOSIS — R7301 Impaired fasting glucose: Secondary | ICD-10-CM | POA: Diagnosis not present

## 2017-11-01 DIAGNOSIS — E559 Vitamin D deficiency, unspecified: Secondary | ICD-10-CM | POA: Diagnosis not present

## 2017-11-01 DIAGNOSIS — E7800 Pure hypercholesterolemia, unspecified: Secondary | ICD-10-CM | POA: Diagnosis not present

## 2017-11-01 DIAGNOSIS — E039 Hypothyroidism, unspecified: Secondary | ICD-10-CM | POA: Diagnosis not present

## 2017-11-04 DIAGNOSIS — E7800 Pure hypercholesterolemia, unspecified: Secondary | ICD-10-CM | POA: Diagnosis not present

## 2017-11-04 DIAGNOSIS — E559 Vitamin D deficiency, unspecified: Secondary | ICD-10-CM | POA: Diagnosis not present

## 2017-11-04 DIAGNOSIS — Z720 Tobacco use: Secondary | ICD-10-CM | POA: Diagnosis not present

## 2017-11-04 DIAGNOSIS — E039 Hypothyroidism, unspecified: Secondary | ICD-10-CM | POA: Diagnosis not present

## 2017-11-04 DIAGNOSIS — R7301 Impaired fasting glucose: Secondary | ICD-10-CM | POA: Diagnosis not present

## 2017-12-18 DIAGNOSIS — E7800 Pure hypercholesterolemia, unspecified: Secondary | ICD-10-CM | POA: Diagnosis not present

## 2017-12-18 DIAGNOSIS — R7301 Impaired fasting glucose: Secondary | ICD-10-CM | POA: Diagnosis not present

## 2017-12-18 DIAGNOSIS — E039 Hypothyroidism, unspecified: Secondary | ICD-10-CM | POA: Diagnosis not present

## 2017-12-18 DIAGNOSIS — E559 Vitamin D deficiency, unspecified: Secondary | ICD-10-CM | POA: Diagnosis not present

## 2018-02-28 DIAGNOSIS — M1712 Unilateral primary osteoarthritis, left knee: Secondary | ICD-10-CM | POA: Diagnosis not present

## 2018-02-28 DIAGNOSIS — M1711 Unilateral primary osteoarthritis, right knee: Secondary | ICD-10-CM | POA: Diagnosis not present

## 2018-02-28 DIAGNOSIS — M25562 Pain in left knee: Secondary | ICD-10-CM | POA: Diagnosis not present

## 2018-03-03 DIAGNOSIS — M8589 Other specified disorders of bone density and structure, multiple sites: Secondary | ICD-10-CM | POA: Diagnosis not present

## 2018-03-03 DIAGNOSIS — Z1231 Encounter for screening mammogram for malignant neoplasm of breast: Secondary | ICD-10-CM | POA: Diagnosis not present

## 2018-04-29 DIAGNOSIS — M25562 Pain in left knee: Secondary | ICD-10-CM | POA: Diagnosis not present

## 2018-05-06 DIAGNOSIS — Z1159 Encounter for screening for other viral diseases: Secondary | ICD-10-CM | POA: Diagnosis not present

## 2018-05-06 DIAGNOSIS — E559 Vitamin D deficiency, unspecified: Secondary | ICD-10-CM | POA: Diagnosis not present

## 2018-05-06 DIAGNOSIS — R0989 Other specified symptoms and signs involving the circulatory and respiratory systems: Secondary | ICD-10-CM | POA: Diagnosis not present

## 2018-05-06 DIAGNOSIS — Z86010 Personal history of colonic polyps: Secondary | ICD-10-CM | POA: Diagnosis not present

## 2018-05-06 DIAGNOSIS — E7800 Pure hypercholesterolemia, unspecified: Secondary | ICD-10-CM | POA: Diagnosis not present

## 2018-05-06 DIAGNOSIS — R7301 Impaired fasting glucose: Secondary | ICD-10-CM | POA: Diagnosis not present

## 2018-05-06 DIAGNOSIS — E039 Hypothyroidism, unspecified: Secondary | ICD-10-CM | POA: Diagnosis not present

## 2018-05-06 DIAGNOSIS — Z1389 Encounter for screening for other disorder: Secondary | ICD-10-CM | POA: Diagnosis not present

## 2018-05-06 DIAGNOSIS — Z0000 Encounter for general adult medical examination without abnormal findings: Secondary | ICD-10-CM | POA: Diagnosis not present

## 2018-05-06 DIAGNOSIS — M810 Age-related osteoporosis without current pathological fracture: Secondary | ICD-10-CM | POA: Diagnosis not present

## 2018-05-07 DIAGNOSIS — Z136 Encounter for screening for cardiovascular disorders: Secondary | ICD-10-CM | POA: Diagnosis not present

## 2018-05-07 DIAGNOSIS — E7800 Pure hypercholesterolemia, unspecified: Secondary | ICD-10-CM | POA: Diagnosis not present

## 2018-05-07 DIAGNOSIS — Z86010 Personal history of colonic polyps: Secondary | ICD-10-CM | POA: Diagnosis not present

## 2018-05-07 DIAGNOSIS — R0989 Other specified symptoms and signs involving the circulatory and respiratory systems: Secondary | ICD-10-CM | POA: Diagnosis not present

## 2018-05-07 DIAGNOSIS — E559 Vitamin D deficiency, unspecified: Secondary | ICD-10-CM | POA: Diagnosis not present

## 2018-05-07 DIAGNOSIS — E039 Hypothyroidism, unspecified: Secondary | ICD-10-CM | POA: Diagnosis not present

## 2018-05-07 DIAGNOSIS — R7301 Impaired fasting glucose: Secondary | ICD-10-CM | POA: Diagnosis not present

## 2018-05-07 DIAGNOSIS — M810 Age-related osteoporosis without current pathological fracture: Secondary | ICD-10-CM | POA: Diagnosis not present

## 2018-05-07 DIAGNOSIS — Z1159 Encounter for screening for other viral diseases: Secondary | ICD-10-CM | POA: Diagnosis not present

## 2018-05-07 DIAGNOSIS — Z0000 Encounter for general adult medical examination without abnormal findings: Secondary | ICD-10-CM | POA: Diagnosis not present

## 2018-11-18 DIAGNOSIS — F419 Anxiety disorder, unspecified: Secondary | ICD-10-CM | POA: Diagnosis not present

## 2018-11-18 DIAGNOSIS — E559 Vitamin D deficiency, unspecified: Secondary | ICD-10-CM | POA: Diagnosis not present

## 2018-11-18 DIAGNOSIS — E7800 Pure hypercholesterolemia, unspecified: Secondary | ICD-10-CM | POA: Diagnosis not present

## 2018-11-18 DIAGNOSIS — M79604 Pain in right leg: Secondary | ICD-10-CM | POA: Diagnosis not present

## 2018-11-18 DIAGNOSIS — E039 Hypothyroidism, unspecified: Secondary | ICD-10-CM | POA: Diagnosis not present

## 2018-11-18 DIAGNOSIS — R7301 Impaired fasting glucose: Secondary | ICD-10-CM | POA: Diagnosis not present

## 2019-05-19 DIAGNOSIS — E039 Hypothyroidism, unspecified: Secondary | ICD-10-CM | POA: Diagnosis not present

## 2019-05-19 DIAGNOSIS — E559 Vitamin D deficiency, unspecified: Secondary | ICD-10-CM | POA: Diagnosis not present

## 2019-05-19 DIAGNOSIS — R7301 Impaired fasting glucose: Secondary | ICD-10-CM | POA: Diagnosis not present

## 2019-05-19 DIAGNOSIS — E7800 Pure hypercholesterolemia, unspecified: Secondary | ICD-10-CM | POA: Diagnosis not present

## 2019-05-19 DIAGNOSIS — M810 Age-related osteoporosis without current pathological fracture: Secondary | ICD-10-CM | POA: Diagnosis not present

## 2019-11-20 DIAGNOSIS — E039 Hypothyroidism, unspecified: Secondary | ICD-10-CM | POA: Diagnosis not present

## 2019-11-20 DIAGNOSIS — R7301 Impaired fasting glucose: Secondary | ICD-10-CM | POA: Diagnosis not present

## 2019-11-20 DIAGNOSIS — E559 Vitamin D deficiency, unspecified: Secondary | ICD-10-CM | POA: Diagnosis not present

## 2019-11-20 DIAGNOSIS — E7800 Pure hypercholesterolemia, unspecified: Secondary | ICD-10-CM | POA: Diagnosis not present

## 2019-11-20 DIAGNOSIS — M810 Age-related osteoporosis without current pathological fracture: Secondary | ICD-10-CM | POA: Diagnosis not present

## 2019-11-30 DIAGNOSIS — M810 Age-related osteoporosis without current pathological fracture: Secondary | ICD-10-CM | POA: Diagnosis not present

## 2019-11-30 DIAGNOSIS — M79673 Pain in unspecified foot: Secondary | ICD-10-CM | POA: Diagnosis not present

## 2019-11-30 DIAGNOSIS — E559 Vitamin D deficiency, unspecified: Secondary | ICD-10-CM | POA: Diagnosis not present

## 2019-11-30 DIAGNOSIS — E039 Hypothyroidism, unspecified: Secondary | ICD-10-CM | POA: Diagnosis not present

## 2019-11-30 DIAGNOSIS — E7800 Pure hypercholesterolemia, unspecified: Secondary | ICD-10-CM | POA: Diagnosis not present

## 2019-11-30 DIAGNOSIS — R7301 Impaired fasting glucose: Secondary | ICD-10-CM | POA: Diagnosis not present

## 2019-11-30 DIAGNOSIS — F419 Anxiety disorder, unspecified: Secondary | ICD-10-CM | POA: Diagnosis not present

## 2020-01-19 DIAGNOSIS — Z20828 Contact with and (suspected) exposure to other viral communicable diseases: Secondary | ICD-10-CM | POA: Diagnosis not present

## 2020-01-24 DIAGNOSIS — Z20828 Contact with and (suspected) exposure to other viral communicable diseases: Secondary | ICD-10-CM | POA: Diagnosis not present

## 2020-02-15 DIAGNOSIS — M79671 Pain in right foot: Secondary | ICD-10-CM | POA: Diagnosis not present

## 2020-02-24 DIAGNOSIS — L538 Other specified erythematous conditions: Secondary | ICD-10-CM | POA: Diagnosis not present

## 2020-02-24 DIAGNOSIS — L570 Actinic keratosis: Secondary | ICD-10-CM | POA: Diagnosis not present

## 2020-02-24 DIAGNOSIS — L821 Other seborrheic keratosis: Secondary | ICD-10-CM | POA: Diagnosis not present

## 2020-02-24 DIAGNOSIS — L814 Other melanin hyperpigmentation: Secondary | ICD-10-CM | POA: Diagnosis not present

## 2020-03-02 ENCOUNTER — Other Ambulatory Visit: Payer: Self-pay

## 2020-03-02 ENCOUNTER — Ambulatory Visit (INDEPENDENT_AMBULATORY_CARE_PROVIDER_SITE_OTHER): Payer: PPO

## 2020-03-02 ENCOUNTER — Ambulatory Visit: Payer: PPO | Admitting: Podiatry

## 2020-03-02 ENCOUNTER — Other Ambulatory Visit: Payer: Self-pay | Admitting: Podiatry

## 2020-03-02 ENCOUNTER — Encounter: Payer: Self-pay | Admitting: Podiatry

## 2020-03-02 VITALS — BP 148/75 | HR 81 | Temp 98.1°F | Resp 16

## 2020-03-02 DIAGNOSIS — M79671 Pain in right foot: Secondary | ICD-10-CM | POA: Diagnosis not present

## 2020-03-02 DIAGNOSIS — M79672 Pain in left foot: Secondary | ICD-10-CM | POA: Diagnosis not present

## 2020-03-02 DIAGNOSIS — R2681 Unsteadiness on feet: Secondary | ICD-10-CM

## 2020-03-02 DIAGNOSIS — G629 Polyneuropathy, unspecified: Secondary | ICD-10-CM | POA: Diagnosis not present

## 2020-03-02 MED ORDER — GABAPENTIN 100 MG PO CAPS: 100.0000 mg | ORAL_CAPSULE | Freq: Every day | ORAL | 3 refills | Status: AC

## 2020-03-02 NOTE — Progress Notes (Signed)
Subjective:   Patient ID: Kendra Mitchell, female   DOB: 74 y.o.   MRN: [id number]   HPI Patient presents stating that she feels like she has no balance of her feet that feels findings a burn at night and at times they are numb.  Patient states is been going on for about a year and she does have history of back issues lower and does smoke a pack per day and would like to be active but cannot because of her gait instability balance issues   Review of Systems  All other systems reviewed and are negative.       Objective:  Physical Exam Vitals and nursing note reviewed.  Constitutional:      Appearance: She is well-developed.  Pulmonary:     Effort: Pulmonary effort is normal.  Musculoskeletal:        General: Normal range of motion.  Skin:    General: Skin is warm.  Neurological:     Mental Status: She is alert.     Vascular status was found to be intact with patient found to have significant loss sharp dull vibratory bilateral and upon gait analysis has a shuffling-like gait and does have issues with balance with history of falls and concerns that she cannot stay active which is very unhealthy for her given her relatively young age.  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  At this point we will get a try her on low-dose gabapentin at night to try to help with the burning while she sleeps and due to her lack of physical activity and instability and history of falls I recommended balance bracing and then sending her to [facility] orthotist for this to be done.  I have also recommended physical therapy to try to improve her proprioception sense of balance as I do not want her to become sedentary and not be able to be active  X-rays indicate that there is no indications of collapsed medial  longitudinal arch or osteoporotic issue

## 2020-03-02 NOTE — Progress Notes (Signed)
   Subjective:    Patient ID: Kendra Mitchell, female    DOB: [birth year], 74 y.o.   MRN: [id number]  HPI    Review of Systems  All other systems reviewed and are negative.      Objective:   Physical Exam        Assessment & Plan:

## 2020-03-14 DIAGNOSIS — Z1231 Encounter for screening mammogram for malignant neoplasm of breast: Secondary | ICD-10-CM | POA: Diagnosis not present

## 2020-03-14 DIAGNOSIS — M85851 Other specified disorders of bone density and structure, right thigh: Secondary | ICD-10-CM | POA: Diagnosis not present

## 2020-03-14 DIAGNOSIS — M85852 Other specified disorders of bone density and structure, left thigh: Secondary | ICD-10-CM | POA: Diagnosis not present

## 2020-03-15 DIAGNOSIS — E7800 Pure hypercholesterolemia, unspecified: Secondary | ICD-10-CM | POA: Diagnosis not present

## 2020-03-15 DIAGNOSIS — R7301 Impaired fasting glucose: Secondary | ICD-10-CM | POA: Diagnosis not present

## 2020-03-15 DIAGNOSIS — Z20828 Contact with and (suspected) exposure to other viral communicable diseases: Secondary | ICD-10-CM | POA: Diagnosis not present

## 2020-03-15 DIAGNOSIS — E039 Hypothyroidism, unspecified: Secondary | ICD-10-CM | POA: Diagnosis not present

## 2020-03-15 DIAGNOSIS — F419 Anxiety disorder, unspecified: Secondary | ICD-10-CM | POA: Diagnosis not present

## 2020-03-15 DIAGNOSIS — M810 Age-related osteoporosis without current pathological fracture: Secondary | ICD-10-CM | POA: Diagnosis not present

## 2020-03-15 DIAGNOSIS — M79673 Pain in unspecified foot: Secondary | ICD-10-CM | POA: Diagnosis not present

## 2020-03-15 DIAGNOSIS — E559 Vitamin D deficiency, unspecified: Secondary | ICD-10-CM | POA: Diagnosis not present

## 2020-04-15 ENCOUNTER — Encounter (HOSPITAL_COMMUNITY): Payer: Self-pay | Admitting: Gastroenterology

## 2020-04-27 ENCOUNTER — Encounter: Payer: Self-pay | Admitting: Neurology

## 2020-05-03 DIAGNOSIS — R259 Unspecified abnormal involuntary movements: Secondary | ICD-10-CM | POA: Diagnosis not present

## 2020-05-03 DIAGNOSIS — R200 Anesthesia of skin: Secondary | ICD-10-CM | POA: Diagnosis not present

## 2020-06-03 DIAGNOSIS — R259 Unspecified abnormal involuntary movements: Secondary | ICD-10-CM | POA: Diagnosis not present

## 2020-06-03 DIAGNOSIS — R200 Anesthesia of skin: Secondary | ICD-10-CM | POA: Diagnosis not present

## 2020-07-04 DIAGNOSIS — E7800 Pure hypercholesterolemia, unspecified: Secondary | ICD-10-CM | POA: Diagnosis not present

## 2020-07-04 DIAGNOSIS — E039 Hypothyroidism, unspecified: Secondary | ICD-10-CM | POA: Diagnosis not present

## 2020-07-04 DIAGNOSIS — Z0000 Encounter for general adult medical examination without abnormal findings: Secondary | ICD-10-CM | POA: Diagnosis not present

## 2020-07-04 DIAGNOSIS — E559 Vitamin D deficiency, unspecified: Secondary | ICD-10-CM | POA: Diagnosis not present

## 2020-07-04 DIAGNOSIS — R030 Elevated blood-pressure reading, without diagnosis of hypertension: Secondary | ICD-10-CM | POA: Diagnosis not present

## 2020-07-29 NOTE — Progress Notes (Signed)
[facility] Neurology [facility] Clinic Note - Initial Visit   Date: 08/01/20  Seriah Brotzman MRN: [id number] DOB: 08/13/1946   Dear Dr. Zachery Dauer:  Thank you for your kind referral of Lillyauna Jenkinson for consultation of neuropathy. Although her history is well known to you, please allow [place] to reiterate it for the purpose of our medical record. The patient was accompanied to the clinic by self.   History of Present Illness: Aeisha Minarik is a 74 y.o. right-handed female with hypothyroidism, hyperlipidemia, osteoporosis, and tobacco use presenting for evaluation of bilateral feet pain.   She complains of heaviness in the legs which started in 2021, which is present all the time.  She feels that her legs tire easily, especially with walking and standing.  She is not able to walk very far without breaks. She has burning sensation in the soles of the feet and spongy feeling.  She has some imbalance and walks unassisted.  She endorses chronic low back pain, which feels very tight. She has occasional leg cramps.  No bowel/bladder issues.   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   Out-side paper records, electronic medical record, and images have been reviewed where available and summarized as:  Labs 11/20/2019: A1c 5.9.  PSA 1.86   Past Medical History:  Diagnosis Date  . Anemia    past history during menses  . Cataract   . Complication of anesthesia   . History of seasonal allergies   . Hypothyroidism   . PONV (postoperative nausea and vomiting)     Past Surgical History:  Procedure Laterality Date  . Bartholins Bilateral   . COLONOSCOPY WITH PROPOFOL N/A 12/12/2015   Procedure: COLONOSCOPY WITH PROPOFOL;  Surgeon: Charolett Bumpers, MD;  Location: [facility] ENDOSCOPY;  Service: Endoscopy;  Laterality: N/A;  . SEPTOPLASTY Bilateral   . TUBAL LIGATION     with Bartholin's cyst     Medications:  Outpatient Encounter Medications as of  08/01/2020  Medication Sig Note  . alendronate (FOSAMAX) 70 MG tablet Take 70 mg by mouth once a week.   . Calcium Citrate-Vitamin D (CALCIUM + D PO) Take 2 tablets by mouth 2 (two) times daily.   Marland Kitchen CALCIUM PO Take 600 mg by mouth daily. Takes 2 a day   . Cholecalciferol (VITAMIN D PO) Take 1-2 tablets by mouth 2 (two) times daily. 11/29/2015: Supplements Calcium + D to equal 3000 units Vit D daily  . Influenza vac split quadrivalent PF (FLUZONE HIGH-DOSE) 0.5 ML injection Fluzone High-Dose 2018-2019 (PF) 180 mcg/0.5 mL intramuscular syringe  TO BE ADMINISTERED BY PHARMACIST FOR IMMUNIZATION   . levothyroxine (SYNTHROID) 75 MCG tablet Take 75 mcg by mouth daily.   Marland Kitchen levothyroxine (SYNTHROID, LEVOTHROID) 75 MCG tablet Take 75 mcg by mouth daily before breakfast.   . simvastatin (ZOCOR) 20 MG tablet Take 20 mg by mouth at bedtime.   . simvastatin (ZOCOR) 20 MG tablet Take 20 mg by mouth daily.   Marland Kitchen VITAMIN D PO Take 1,000 mg by mouth daily.   Marland Kitchen gabapentin (NEURONTIN) 100 MG capsule Take 1 capsule (100 mg total) by mouth at bedtime. (Patient not taking: Reported on 08/01/2020)    No facility-administered encounter medications on file as of 08/01/2020.    Allergies:  Allergies  Allergen Reactions  . Codeine Nausea Only  . Sulfa Antibiotics Other (See Comments)    Made her feel bad  . Sulfa Antibiotics     Pt stated, "It makes  me feel worse than when I took it"    Family History: Family History  Problem Relation Age of Onset  . Lung disease Mother   . Heart failure Father   . Hypertension Brother     Social History: Social History   Tobacco Use  . Smoking status: Current Every Day Smoker    Packs/day: 1.00    Years: 50.00    Pack years: 50.00    Types: Cigarettes  . Smokeless tobacco: Never Used  Substance Use Topics  . Alcohol use: No  . Drug use: No   Social History   Social History Narrative   ** Merged History Encounter **    right handed   Two story home    Drinks  decaff coffee    Vital Signs:  BP 131/64   Pulse 66   Resp 20   Ht 4\' 11"  (1.499 m)   Wt 133 lb (60.3 kg)   SpO2 97%   BMI 26.86 kg/m   Neurological Exam: MENTAL STATUS including orientation to time, place, person, recent and remote memory, attention span and concentration, language, and fund of knowledge is normal.  Speech is not dysarthric.  CRANIAL NERVES: II:  No visual field defects.  Unremarkable fundi.   III-IV-VI: Pupils equal round and reactive to light.  Normal conjugate, extra-ocular eye movements in all directions of gaze.  No nystagmus.  No ptosis.   V:  Normal facial sensation.    VII:  Normal facial symmetry and movements.   VIII:  Normal hearing and vestibular function.   IX-X:  Normal palatal movement.   XI:  Normal shoulder shrug and head rotation.   XII:  Normal tongue strength and range of motion, no deviation or fasciculation.  MOTOR:  No atrophy, fasciculations or abnormal movements.  No pronator drift.   Upper Extremity:  Right  Left  Deltoid  5/5   5/5   Biceps  5/5   5/5   Triceps  5/5   5/5   Infraspinatus 5/5  5/5  Medial pectoralis 5/5  5/5  Wrist extensors  5/5   5/5   Wrist flexors  5/5   5/5   Finger extensors  5/5   5/5   Finger flexors  5/5   5/5   Dorsal interossei  5/5   5/5   Abductor pollicis  5/5   5/5   Tone (Ashworth scale)  0  0   Lower Extremity:  Right  Left  Hip flexors  5/5   5/5   Hip extensors  5/5   5/5   Adductor 5/5  5/5  Abductor 5/5  5/5  Knee flexors  5/5   5/5   Knee extensors  5/5   5/5   Dorsiflexors  5/5   5/5   Plantarflexors  5/5   5/5   Toe extensors  5/5   5/5   Toe flexors  5/5   5/5   Tone (Ashworth scale)  0  0   MSRs:  Right        Left                  brachioradialis 2+  2+  biceps 2+  2+  triceps 2+  2+  patellar 3+  3+  ankle jerk 2+  2+  Hoffman no  no  plantar response down  down   SENSORY:  Vibration is absent at the knees and feet bilaterally, intact at the MCP.  Temperature and  pin prick is slightly reduced in the feet. Romberg's sign absent.   COORDINATION/GAIT: Normal finger-to- nose-finger and heel-to-shin.  Intact rapid alternating movements bilaterally.  Unable to rise from a chair without using arms.  Gait is wide-based, slow and cautious.  She is able to perform stressed gait, unable to do tandem gait.   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  Neuropathy seems less likely given proximal symptoms and presence of reflexes.  I will check MRI lumbar spine wo contrast to assess for compressive pathology.  Further recommendations pending results.  Thank you for allowing me to participate in patient's care.  If I can answer any additional questions, I would be pleased to do so.    Sincerely,    Jaylah Goodlow K. Allena Katz, DO

## 2020-08-01 ENCOUNTER — Encounter: Payer: Self-pay | Admitting: Neurology

## 2020-08-01 ENCOUNTER — Other Ambulatory Visit: Payer: Self-pay

## 2020-08-01 ENCOUNTER — Ambulatory Visit (INDEPENDENT_AMBULATORY_CARE_PROVIDER_SITE_OTHER): Payer: PPO | Admitting: Neurology

## 2020-08-01 VITALS — BP 131/64 | HR 66 | Resp 20 | Ht 59.0 in | Wt 133.0 lb

## 2020-08-01 DIAGNOSIS — M48062 Spinal stenosis, lumbar region with neurogenic claudication: Secondary | ICD-10-CM

## 2020-08-01 DIAGNOSIS — R202 Paresthesia of skin: Secondary | ICD-10-CM | POA: Diagnosis not present

## 2020-08-01 DIAGNOSIS — R29898 Other symptoms and signs involving the musculoskeletal system: Secondary | ICD-10-CM

## 2020-08-01 NOTE — Patient Instructions (Addendum)
We will check MRI lumbar spine without contrast and let you know the results  We have sent a referral to [facility] for your MRI and they will call you directly to schedule your appointment. They are located at [address] [facility]. If you need to contact them directly please call [phone].

## 2020-08-09 ENCOUNTER — Other Ambulatory Visit: Payer: Self-pay

## 2020-08-09 DIAGNOSIS — M48062 Spinal stenosis, lumbar region with neurogenic claudication: Secondary | ICD-10-CM

## 2020-08-09 DIAGNOSIS — R29898 Other symptoms and signs involving the musculoskeletal system: Secondary | ICD-10-CM

## 2020-08-30 ENCOUNTER — Ambulatory Visit
Admission: RE | Admit: 2020-08-30 | Discharge: 2020-08-30 | Disposition: A | Discharge status: Home / Self Care | Payer: PPO | Source: Ambulatory Visit | Attending: Neurology | Admitting: Neurology

## 2020-08-30 ENCOUNTER — Other Ambulatory Visit: Payer: Self-pay

## 2020-08-30 DIAGNOSIS — M48062 Spinal stenosis, lumbar region with neurogenic claudication: Secondary | ICD-10-CM

## 2020-08-30 DIAGNOSIS — R531 Weakness: Secondary | ICD-10-CM | POA: Diagnosis not present

## 2020-08-30 DIAGNOSIS — M4319 Spondylolisthesis, multiple sites in spine: Secondary | ICD-10-CM | POA: Diagnosis not present

## 2020-08-30 DIAGNOSIS — M48061 Spinal stenosis, lumbar region without neurogenic claudication: Secondary | ICD-10-CM | POA: Diagnosis not present

## 2020-08-30 DIAGNOSIS — R29898 Other symptoms and signs involving the musculoskeletal system: Secondary | ICD-10-CM

## 2020-08-30 DIAGNOSIS — M47816 Spondylosis without myelopathy or radiculopathy, lumbar region: Secondary | ICD-10-CM | POA: Diagnosis not present

## 2020-08-30 IMAGING — MR MR LUMBAR SPINE WO/W CM
4 of 7 series · 23 of 48 positions shown · IV contrast (multihance)
Comparison: None.

CLINICAL DATA: Low back pain with bilateral leg weakness

EXAM:
MRI LUMBAR SPINE WITHOUT AND WITH CONTRAST
TECHNIQUE: Multiplanar and multiecho pulse sequences of the lumbar spine were
obtained without and with intravenous contrast.
CONTRAST:  12mL MULTIHANCE GADOBENATE DIMEGLUMINE 529 MG/ML IV SOLN

[Series 3: T1 · sagittal · 4.0mm · 0.88mm/px · 3 of 15 slices shown (1 of 2)]
[im 1/15]
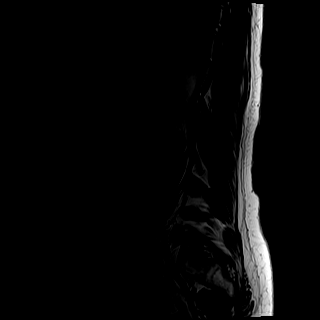
[im 8/15]
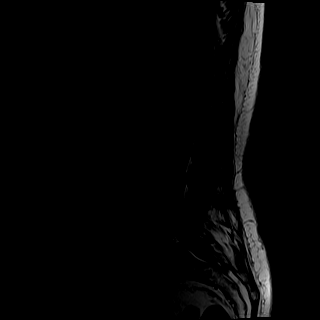
[im 15/15]
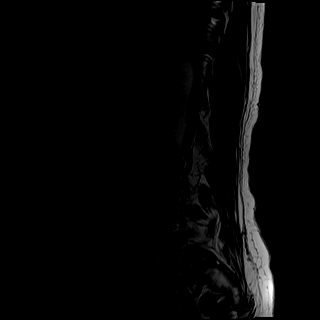

[Series 5: T2 · axial · 4.0mm · 0.39mm/px · z∈[-106,+97]mm · 11 of 42 slices shown (1 of 2)]
[im 1/42]
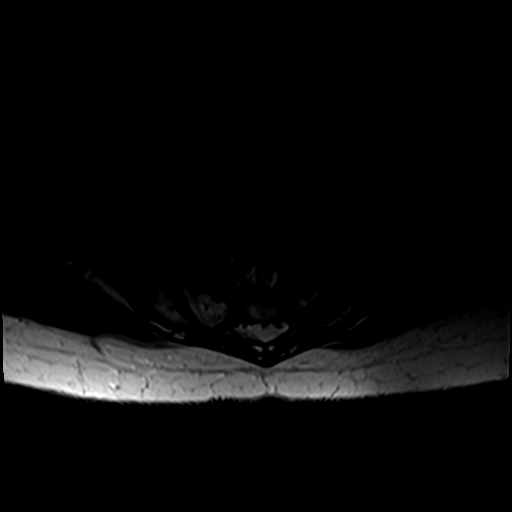
[im 5/42]
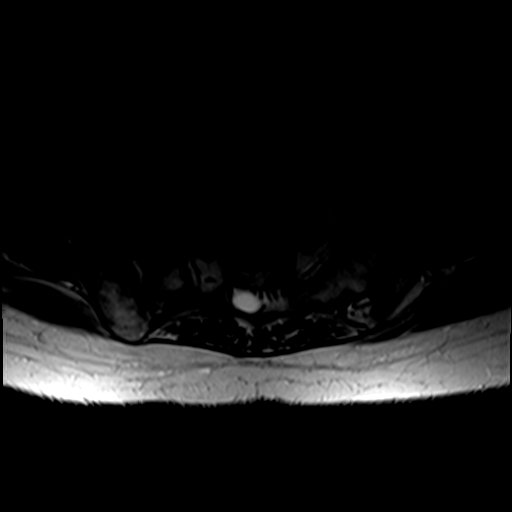
[im 9/42]
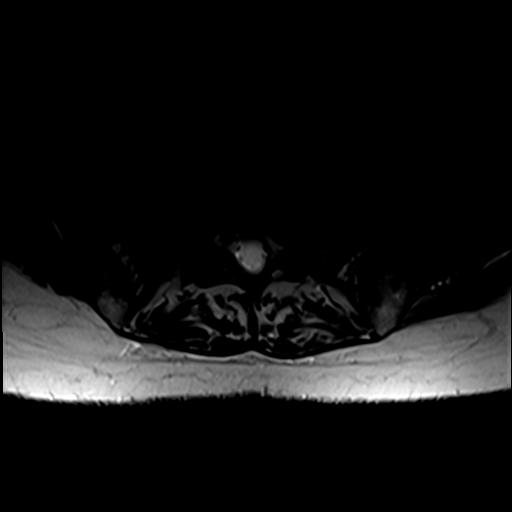
[im 13/42]
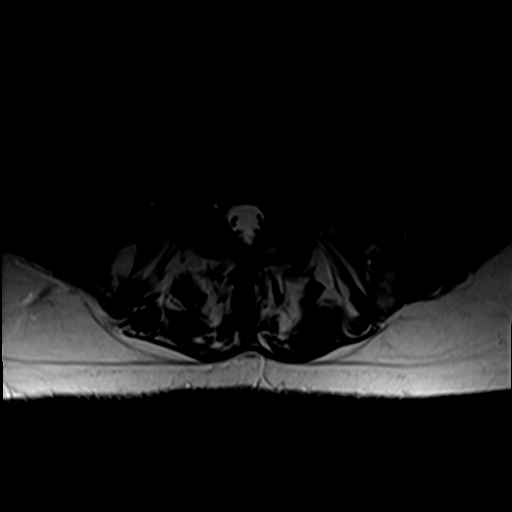
[im 17/42]
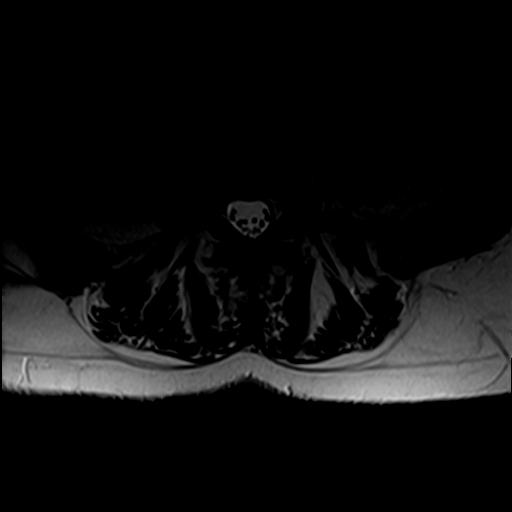
[im 21/42]
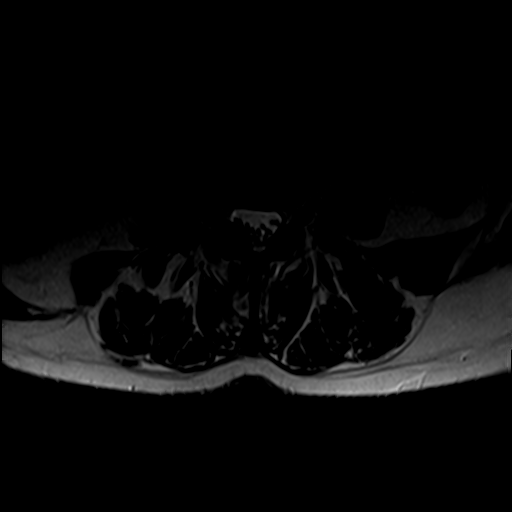
[im 25/42]
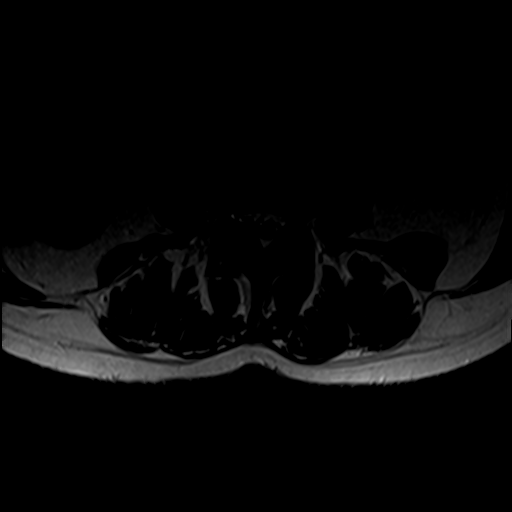
[im 29/42]
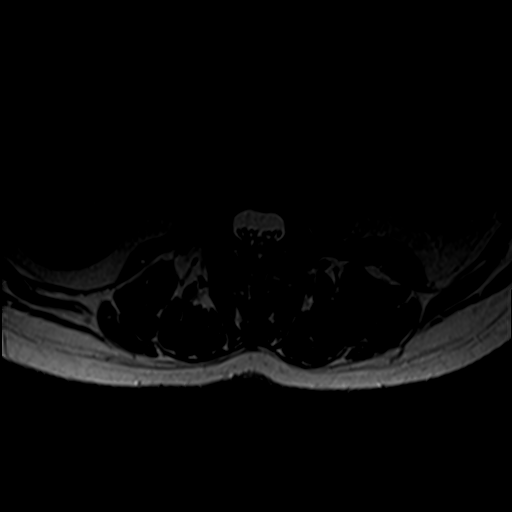
[im 33/42]
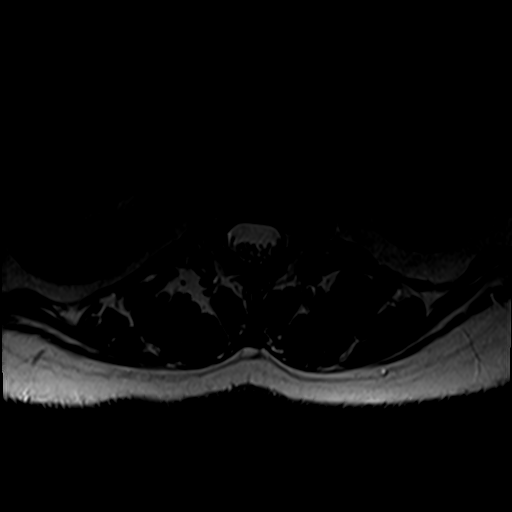
[im 37/42]
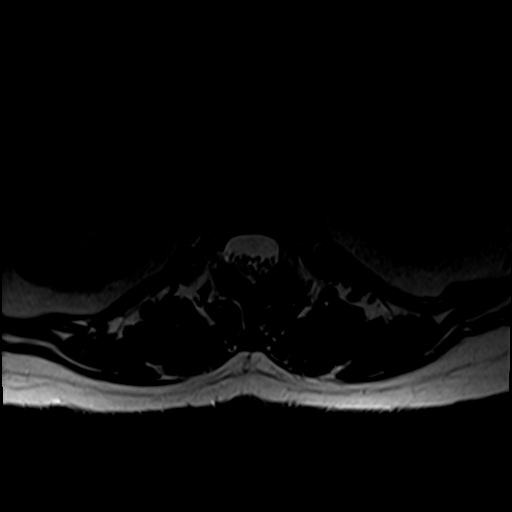
[im 42/42]
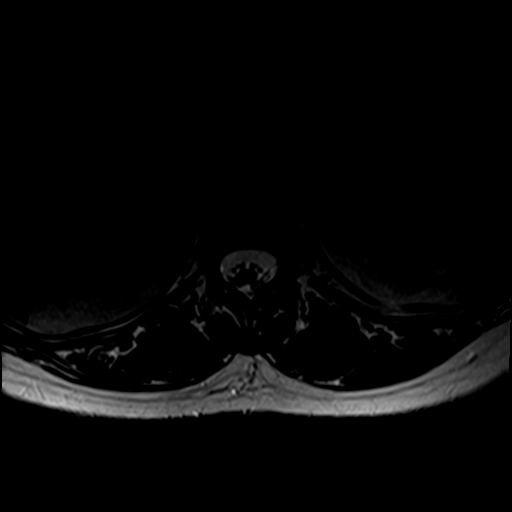

[Series 6: T1 · axial · 4.0mm · 0.39mm/px · z∈[-106,+73]mm · 5 of 42 slices shown (2 of 2)]
[im 1/42]
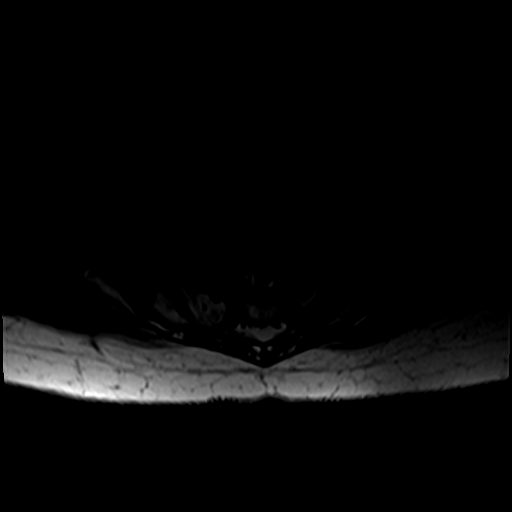
[im 5/42]
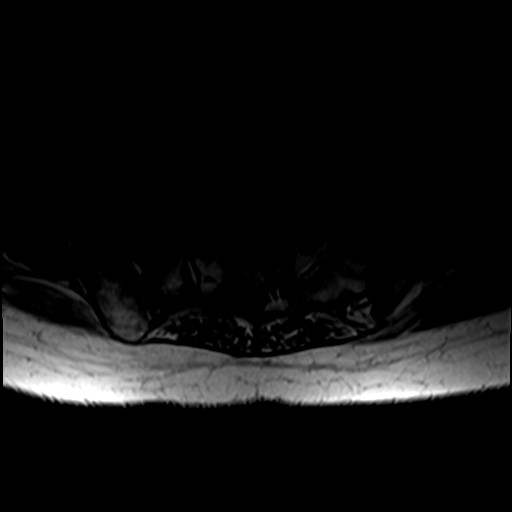
[im 9/42]
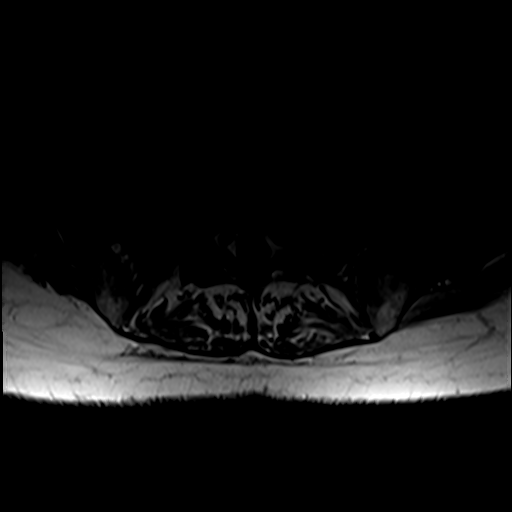
[im 21/42]
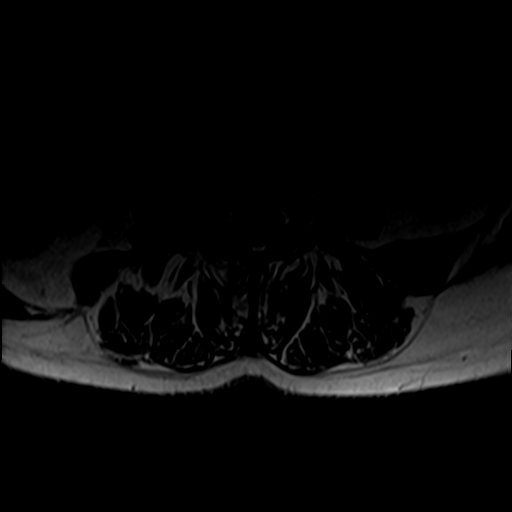
[im 37/42]
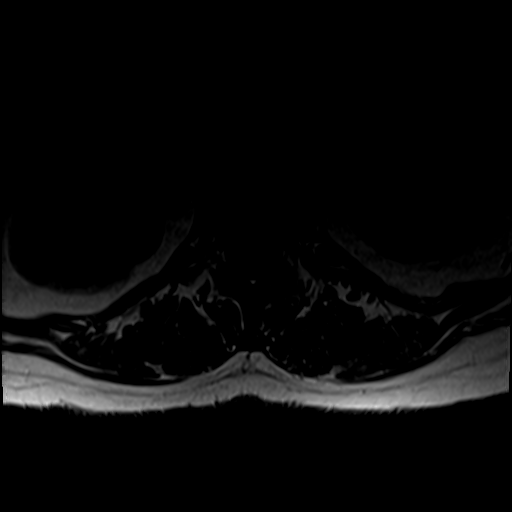

[Series 7: T2 · sagittal · 4.0mm · 0.88mm/px · 4 of 15 slices shown (2 of 2)]
[im 1/15]
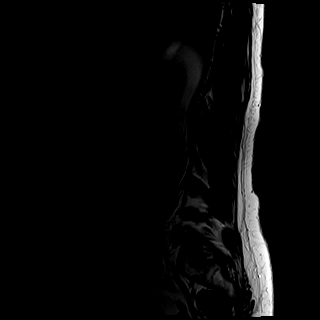
[im 5/15]
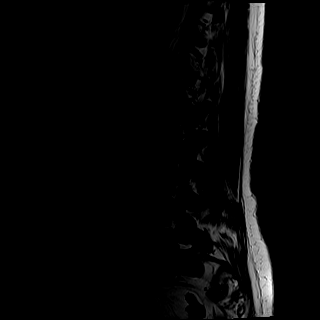
[im 10/15]
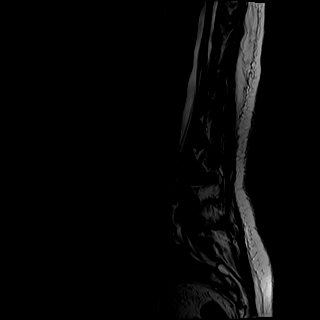
[im 15/15]
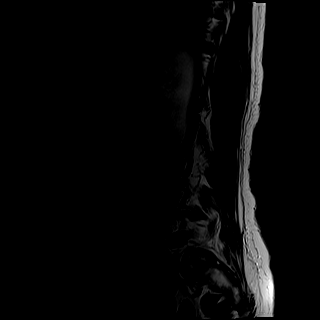

[23 of 48 positions shown; findings below may reference images not displayed]

FINDINGS: Segmentation:  Standard lumbar numbering

Alignment: Anterolisthesis at L5-S1 with chronic appearing pars
defect on the left at L5. mild levocurvature

Vertebrae: Discogenic endplate edema at L2-3. Mild right-sided facet
edema at L3-4.

Conus medullaris and cauda equina: Conus extends to the T12-L1
level. Conus and cauda equina appear normal.

Paraspinal and other soft tissues: Right renal cystic intensity.
Possible 6 mm right renal calculus. Mild paraspinal inflammation
around the right L3-4 facet.

Disc levels:

T12- L1: Unremarkable.

L1-L2: Unremarkable.

L2-L3: Disc narrowing and endplate degeneration with bulging and
ridging. No neural compression

L3-L4: Disc narrowing and bulging with endplate ridging. Minor facet
spurring. No neural compression

L4-L5: Disc narrowing and endplate degeneration with ridging. Minor
facet spurring. No neural compression

L5-S1:Facet osteoarthritis and left-sided pars defect. Grade 1
anterolisthesis. Disc narrowing and bulging with noncompressive left
foraminal narrowing. Patent spinal canal
IMPRESSION: 1. Disc degeneration at L2-3 and below with mild discogenic edema at
L2-3.
2. Overall mild degenerative facet spurring. Mild active arthritis
on the right at L3-4.
3. L5 chronic left pars defect with L5-S1 anterolisthesis.
4. No compressive stenosis.

## 2020-08-30 MED ORDER — GADOBENATE DIMEGLUMINE 529 MG/ML IV SOLN
12.0000 mL | Freq: Once | INTRAVENOUS | Status: AC | PRN
  Administered 2020-08-30: 12 mL via INTRAVENOUS

## 2020-08-31 ENCOUNTER — Telehealth: Payer: Self-pay | Admitting: Neurology

## 2020-08-31 DIAGNOSIS — R202 Paresthesia of skin: Secondary | ICD-10-CM

## 2020-08-31 DIAGNOSIS — R29898 Other symptoms and signs involving the musculoskeletal system: Secondary | ICD-10-CM

## 2020-08-31 NOTE — Telephone Encounter (Signed)
Called and discussed results of MRI with patient which showed degenerative changes without nerve impingement or compression. Next step is to check for vascular claudication and neuropathy with ABI and EMG, respectively.

## 2020-09-01 NOTE — Telephone Encounter (Signed)
Order have been completed.

## 2020-09-06 ENCOUNTER — Other Ambulatory Visit: Payer: Self-pay | Admitting: Neurology

## 2020-09-06 ENCOUNTER — Ambulatory Visit
Admission: RE | Admit: 2020-09-06 | Discharge: 2020-09-06 | Disposition: A | Discharge status: Home / Self Care | Payer: PPO | Source: Ambulatory Visit | Attending: Neurology | Admitting: Neurology

## 2020-09-06 DIAGNOSIS — R29898 Other symptoms and signs involving the musculoskeletal system: Secondary | ICD-10-CM

## 2020-09-06 DIAGNOSIS — R202 Paresthesia of skin: Secondary | ICD-10-CM

## 2020-09-06 DIAGNOSIS — I70213 Atherosclerosis of native arteries of extremities with intermittent claudication, bilateral legs: Secondary | ICD-10-CM | POA: Diagnosis not present

## 2020-09-08 ENCOUNTER — Telehealth: Payer: Self-pay

## 2020-09-08 ENCOUNTER — Telehealth: Payer: Self-pay | Admitting: Neurology

## 2020-09-08 DIAGNOSIS — R202 Paresthesia of skin: Secondary | ICD-10-CM

## 2020-09-08 NOTE — Telephone Encounter (Signed)
-----   Message from Donika K Patel, DO sent at 09/07/2020  3:44 PM EDT ----- Please inform patient that arterial studies of the legs is normal and circulation to the feet looks good. 

## 2020-09-08 NOTE — Telephone Encounter (Signed)
Called patient and informed her of results. Patient wanted to know what the next step is? Patient mentioned an EMG was discussed at her last appt? Informed patient that I would sent Dr. Allena Katz a message to advise.

## 2020-09-08 NOTE — Telephone Encounter (Signed)
-----   Message from Kendra K Patel, DO sent at 09/07/2020  3:44 PM EDT ----- Please inform patient that arterial studies of the legs is normal and circulation to the feet looks good. 

## 2020-09-08 NOTE — Telephone Encounter (Signed)
-----   Message from Glendale Chard, DO sent at 09/07/2020  3:44 PM EDT ----- Please inform patient that arterial studies of the legs is normal and circulation to the feet looks good.

## 2020-09-08 NOTE — Telephone Encounter (Signed)
Yes, please order EMG bilateral legs.

## 2020-10-04 DIAGNOSIS — R030 Elevated blood-pressure reading, without diagnosis of hypertension: Secondary | ICD-10-CM | POA: Diagnosis not present

## 2020-10-04 DIAGNOSIS — M48062 Spinal stenosis, lumbar region with neurogenic claudication: Secondary | ICD-10-CM | POA: Diagnosis not present

## 2020-10-24 DIAGNOSIS — F419 Anxiety disorder, unspecified: Secondary | ICD-10-CM | POA: Diagnosis not present

## 2020-10-24 DIAGNOSIS — E039 Hypothyroidism, unspecified: Secondary | ICD-10-CM | POA: Diagnosis not present

## 2020-10-24 DIAGNOSIS — R7303 Prediabetes: Secondary | ICD-10-CM | POA: Diagnosis not present

## 2020-10-24 DIAGNOSIS — R202 Paresthesia of skin: Secondary | ICD-10-CM | POA: Diagnosis not present

## 2020-10-24 DIAGNOSIS — E559 Vitamin D deficiency, unspecified: Secondary | ICD-10-CM | POA: Diagnosis not present

## 2020-10-24 DIAGNOSIS — E7800 Pure hypercholesterolemia, unspecified: Secondary | ICD-10-CM | POA: Diagnosis not present

## 2020-10-24 DIAGNOSIS — I10 Essential (primary) hypertension: Secondary | ICD-10-CM | POA: Diagnosis not present

## 2020-10-26 ENCOUNTER — Ambulatory Visit: Payer: PPO | Admitting: Neurology

## 2020-10-26 ENCOUNTER — Other Ambulatory Visit: Payer: Self-pay

## 2020-10-26 DIAGNOSIS — R202 Paresthesia of skin: Secondary | ICD-10-CM

## 2020-10-26 NOTE — Procedures (Addendum)
[facility]  [address] [place], [address]  [place], [place] [address] Tel: [phone] Fax:  [phone] Test Date:  10/26/2020  Patient: Kendra Mitchell DOB: 1946/05/29 Physician: Nita Sickle, DO  Sex: Female Height: 4\' 11"  Ref Phys: , DO  ID#: Nita Sickle   Technician:    Patient Complaints: This is a 73 year-old female referred for evaluation of bilateral leg pain.  NCV & EMG Findings: Extensive electrodiagnostic testing of the right lower extremity and additional studies of the left shows:  1. Bilateral sural and superficial peroneal sensory responses are within normal limits. 2. Bilateral peroneal motor responses showed reduced amplitude at the extensor digitorum brevis, and is normal at the tibialis anterior.  Bilateral tibial motor responses are within normal limits. 3. Bilateral tibial H reflex studies are within normal limits. 4. There is no evidence of active or chronic motor axonal loss changes affecting any of the tested muscles.  Motor unit configuration and recruitment pattern is within normal limits.  Impression: This is a normal study of the lower extremities.  In particular, there is no evidence of a diffuse myopathy, lumbosacral radiculopathy, or sensorimotor polyneuropathy.   ___________________________ 65, DO    Nerve Conduction Studies Anti Sensory Summary Table   Stim Site NR Peak (ms) Norm Peak (ms) P-T Amp (V) Norm P-T Amp  Left Sup Peroneal Anti Sensory (Ant Lat Mall)  32C  12 cm    2.4 <4.6 12.6 >3  Right Sup Peroneal Anti Sensory (Ant Lat Mall)  32C  12 cm    2.4 <4.6 9.2 >3  Left Sural Anti Sensory (Lat Mall)  32C  Calf    3.2 <4.6 15.8 >3  Right Sural Anti Sensory (Lat Mall)  32C  Calf    2.1 <4.6 19.1 >3   Motor Summary Table   Stim Site NR Onset (ms) Norm Onset (ms) O-P Amp (mV) Norm O-P Amp Site1 Site2 Delta-0 (ms) Dist (cm) Vel (m/s) Norm Vel (m/s)  Left Peroneal Motor (Ext Dig Brev)  32C  Ankle     5.0 <6.0 1.8 >2.5 B Fib Ankle 6.2 32.0 52 >40  B Fib    11.2  1.0  Poplt B Fib 1.4 7.0 50 >40  Poplt    12.6  1.3         Right Peroneal Motor (Ext Dig Brev)  32C  Ankle    3.7 <6.0 2.3 >2.5 B Fib Ankle 6.9 32.0 46 >40  B Fib    10.6  1.5  Poplt B Fib 1.7 7.0 41 >40  Poplt    12.3  1.5         Left Peroneal TA Motor (Tib Ant)  32C  Fib Head    2.6 <4.5 6.1 >3 Poplit Fib Head 1.2 7.0 58 >40  Poplit    3.8  6.0         Right Peroneal TA Motor (Tib Ant)  32C  Fib Head    3.2 <4.5 5.8 >3 Poplit Fib Head 1.3 7.0 54 >40  Poplit    4.5  5.5         Left Tibial Motor (Abd Hall Brev)  32C  Ankle    4.6 <6.0 12.5 >4 Knee Ankle 7.3 37.0 51 >40  Knee    11.9  9.8         Right Tibial Motor (Abd Hall Brev)  32C  Ankle    4.0 <6.0 12.5 >4 Knee Ankle 8.7 38.0 44 >40  Knee    12.7  7.5          H Reflex Studies   NR H-Lat (ms) Lat Norm (ms) L-R H-Lat (ms)  Left Tibial (Gastroc)  32C     30.75 <35 0.68  Right Tibial (Gastroc)  32C     31.43 <35 0.68   EMG   Side Muscle Ins Act Fibs Psw Fasc Number Recrt Dur Dur. Amp Amp. Poly Poly. Comment  Right AntTibialis Nml Nml Nml Nml Nml Nml Nml Nml Nml Nml Nml Nml N/A  Right Gastroc Nml Nml Nml Nml Nml Nml Nml Nml Nml Nml Nml Nml N/A  Right Flex Dig Long Nml Nml Nml Nml Nml Nml Nml Nml Nml Nml Nml Nml N/A  Right RectFemoris Nml Nml Nml Nml Nml Nml Nml Nml Nml Nml Nml Nml N/A  Right GluteusMed Nml Nml Nml Nml Nml Nml Nml Nml Nml Nml Nml Nml N/A  Left AntTibialis Nml Nml Nml Nml Nml Nml Nml Nml Nml Nml Nml Nml N/A  Left Gastroc Nml Nml Nml Nml Nml Nml Nml Nml Nml Nml Nml Nml N/A  Left Flex Dig Long Nml Nml Nml Nml Nml Nml Nml Nml Nml Nml Nml Nml N/A  Left RectFemoris Nml Nml Nml Nml Nml Nml Nml Nml Nml Nml Nml Nml N/A  Left GluteusMed Nml Nml Nml Nml Nml Nml Nml Nml Nml Nml Nml Nml N/A      Waveforms:

## 2020-11-07 ENCOUNTER — Other Ambulatory Visit: Payer: Self-pay | Admitting: Orthotics

## 2020-11-08 ENCOUNTER — Other Ambulatory Visit: Payer: Self-pay

## 2020-11-08 ENCOUNTER — Ambulatory Visit: Payer: PPO | Admitting: Orthotics

## 2020-11-08 DIAGNOSIS — R2681 Unsteadiness on feet: Secondary | ICD-10-CM

## 2020-11-08 DIAGNOSIS — M79672 Pain in left foot: Secondary | ICD-10-CM

## 2020-11-08 DIAGNOSIS — M79671 Pain in right foot: Secondary | ICD-10-CM

## 2020-11-08 NOTE — Progress Notes (Signed)
Patient came in today for Eval/assessment for Moore Balance Brace.  Patient presents with a history of falling, and also fearful of falling.  Balance tests performed and patient does have issues keeping balance especially in foot to heel test.  Patient has week dorsiflexors and well as inverters/everters muscle group.  Demonstrants ankle instability.   

## 2020-11-11 ENCOUNTER — Ambulatory Visit: Payer: PPO | Admitting: Neurology

## 2020-11-11 ENCOUNTER — Encounter: Payer: Self-pay | Admitting: Neurology

## 2020-11-11 ENCOUNTER — Other Ambulatory Visit: Payer: Self-pay

## 2020-11-11 VITALS — BP 133/81 | HR 92 | Ht 59.0 in | Wt 131.0 lb

## 2020-11-11 DIAGNOSIS — R29898 Other symptoms and signs involving the musculoskeletal system: Secondary | ICD-10-CM

## 2020-11-11 DIAGNOSIS — G959 Disease of spinal cord, unspecified: Secondary | ICD-10-CM | POA: Diagnosis not present

## 2020-11-11 NOTE — Progress Notes (Signed)
Follow-up Visit   Date: 11/11/20   Kendra Mitchell MRN: [id number] DOB: 18-Jun-1946   Interim History: Kendra Mitchell is a 74 y.o. right-handed Caucasian female with hypothyroidism, hyperlipidemia, osteoporosis, and tobacco use returning to the clinic for follow-up of bilateral feet and leg pain.  The patient was accompanied to the clinic by self.  History of present illness: She complains of heaviness in the legs which started in 2021, which is present all the time.  She feels that her legs tire easily, especially with walking and standing.  She is not able to walk very far without breaks. She has burning sensation in the soles of the feet and spongy feeling.  She has some imbalance and walks unassisted.  She endorses chronic low back pain, which feels very tight. She has occasional leg cramps.  No bowel/bladder issues.   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   UPDATE 11/11/2020: She is here for follow-up visit.  There has been no change in her symptoms. She continues to have heavy sensation in bilateral upper legs, making it difficult to walk, climb stars, or get up from a chair.  She endorse mild numbness in the feet. So far, she has MRI lumbar spine, NCS/EMG, and ABI which has been unremarkable. She will be seeing Vein specialist to determine if her symptoms could be stemming from varicose veins.   Medications:  Current Outpatient Medications on File Prior to Visit  Medication Sig Dispense Refill  . alendronate (FOSAMAX) 70 MG tablet Take 70 mg by mouth once a week.    . calcium chloride in sodium chloride 0.9 % 50 mL cal mag zinc- 4 tablets    . Calcium Citrate-Vitamin D (CALCIUM + D PO) Take 2 tablets by mouth 2 (two) times daily.    Marland Kitchen CALCIUM PO Take 600 mg by mouth daily. Takes 2 a day    . Cholecalciferol (VITAMIN D PO) Take 1-2 tablets by mouth 2 (two) times daily.    . hydrochlorothiazide (MICROZIDE) 12.5 MG capsule 1 tablet    .  Influenza vac split quadrivalent PF (FLUZONE HIGH-DOSE) 0.5 ML injection Fluzone High-Dose 2018-2019 (PF) 180 mcg/0.5 mL intramuscular syringe  TO BE ADMINISTERED BY PHARMACIST FOR IMMUNIZATION    . levothyroxine (SYNTHROID) 75 MCG tablet Take 75 mcg by mouth daily.    . simvastatin (ZOCOR) 20 MG tablet Take 20 mg by mouth at bedtime.    . simvastatin (ZOCOR) 20 MG tablet Take 20 mg by mouth daily.    Marland Kitchen VITAMIN D PO Take 1,000 mg by mouth daily.    Marland Kitchen gabapentin (NEURONTIN) 100 MG capsule Take 1 capsule (100 mg total) by mouth at bedtime. (Patient not taking: No sig reported) 30 capsule 3  . levothyroxine (SYNTHROID, LEVOTHROID) 75 MCG tablet Take 75 mcg by mouth daily before breakfast.     No current facility-administered medications on file prior to visit.    Allergies:  Allergies  Allergen Reactions  . Codeine Nausea Only  . Sulfa Antibiotics Other (See Comments)    Made her feel bad  . Sulfa Antibiotics     Pt stated, "It makes me feel worse than when I took it"    Vital Signs:  BP 133/81   Pulse 92   Ht 4\' 11"  (1.499 m)   Wt 131 lb (59.4 kg)   SpO2 98%   BMI 26.46 kg/m   Neurological Exam: MENTAL STATUS including orientation to time, place, person, recent and remote  memory, attention span and concentration, language, and fund of knowledge is normal.  Speech is not dysarthric.  MOTOR:  Motor strength is 5/5 in all extremities, including hip flexors, although hip flexion ROM seems reduced.  No atrophy, fasciculations or abnormal movements.  No pronator drift.  Tone is normal.    MSRs:  Reflexes are 2+/4 throughout, except 3+/4 bilateral patella with spread.  SENSORY:  Vibration absent at the feet bilaterally.  COORDINATION/GAIT:   Intact rapid alternating movements bilaterally.  Gait wide-based, stooped, slow, unassisted.   Data: ABI 09/06/2020:  Resting ABI the bilateral lower extremities within normal limits with the segmental exam demonstrating no significant arterial  occlusive disease.  NCS/EMG of the legs 10/26/2020:  Normal  MRI lumbar spine wwo contrast 08/31/2020: 1. 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 2. Overall mild degenerative facet spurring. Mild active arthritis on the right at L3-4. 3. 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 4. No compressive stenosis.  Labs 11/20/2019: A1c 5.9.  PSA 1.86  IMPRESSION/PLAN: Bilateral leg heaviness, concerning for claudication.  MRI lumbar spine does not show spinal stenosis and ABI shows normal distal flow.  NCS/EMG of the legs was also normal, specifically, no myopathy, neuropathy, or radiculopathy.  With her hyperreflexia in the legs, I will look further up her spine to assess for compressive pathology with MRI thoracic and cervical spine wo contrast.  PT declined Fall precautions discussed  Further recommendations pending results.   Thank you for allowing me to participate in patient's care.  If I can answer any additional questions, I would be pleased to do so.    Sincerely,    Evonte Prestage K. Allena Katz, DO

## 2020-11-11 NOTE — Patient Instructions (Addendum)
MRI thoracic and cervical spine without contrast.  We will call you with the results.   Please contact my office, if you would like to start physical therapy  [facility]: [phone]

## 2020-12-09 ENCOUNTER — Ambulatory Visit
Admission: RE | Admit: 2020-12-09 | Discharge: 2020-12-09 | Disposition: A | Discharge status: Home / Self Care | Payer: PPO | Source: Ambulatory Visit | Attending: Neurology | Admitting: Neurology

## 2020-12-09 DIAGNOSIS — G959 Disease of spinal cord, unspecified: Secondary | ICD-10-CM

## 2020-12-09 DIAGNOSIS — M4802 Spinal stenosis, cervical region: Secondary | ICD-10-CM | POA: Diagnosis not present

## 2020-12-09 DIAGNOSIS — M5124 Other intervertebral disc displacement, thoracic region: Secondary | ICD-10-CM | POA: Diagnosis not present

## 2020-12-09 DIAGNOSIS — R29898 Other symptoms and signs involving the musculoskeletal system: Secondary | ICD-10-CM

## 2020-12-09 DIAGNOSIS — M47814 Spondylosis without myelopathy or radiculopathy, thoracic region: Secondary | ICD-10-CM | POA: Diagnosis not present

## 2020-12-09 DIAGNOSIS — M47812 Spondylosis without myelopathy or radiculopathy, cervical region: Secondary | ICD-10-CM | POA: Diagnosis not present

## 2020-12-09 IMAGING — MR MR THORACIC SPINE W/O CM
6 series · 37 of 48 positions shown · non-contrast
Comparison: None.

CLINICAL DATA: Myelopathy, acute or progressive

EXAM:
MRI CERVICAL AND THORACIC SPINE WITHOUT CONTRAST
TECHNIQUE: Multiplanar and multiecho pulse sequences of the cervical spine, to
include the craniocervical junction and cervicothoracic junction,
and the thoracic spine, were obtained without intravenous contrast.

[Series 3: T2 · sagittal · 3.0mm · 0.41mm/px · 6 of 15 slices shown (1 of 2)]
[im 1/15]
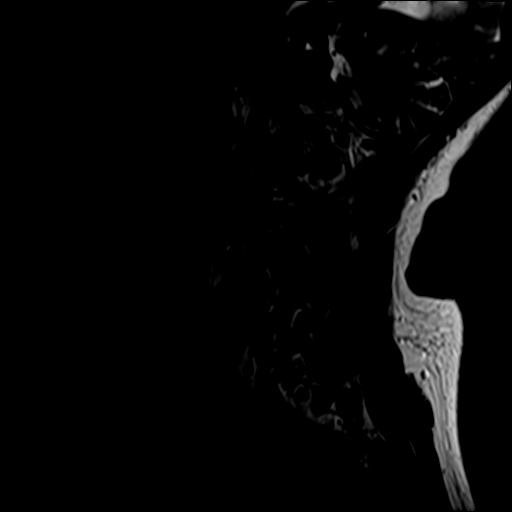
[im 3/15]
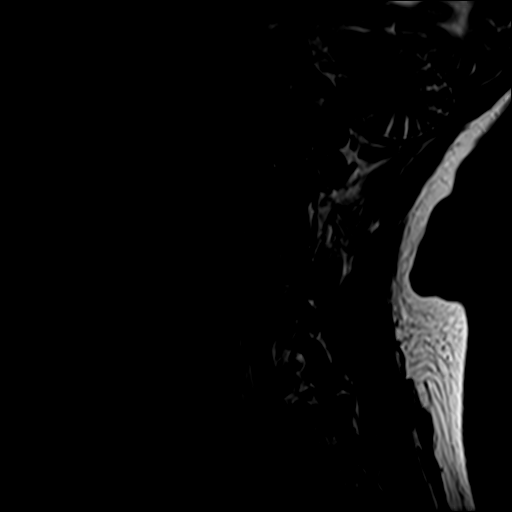
[im 6/15]
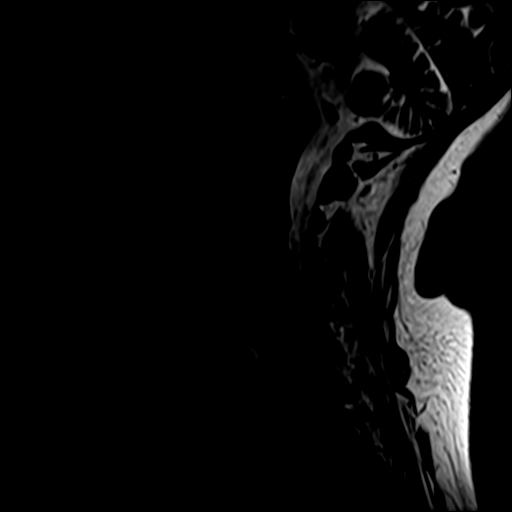
[im 9/15]
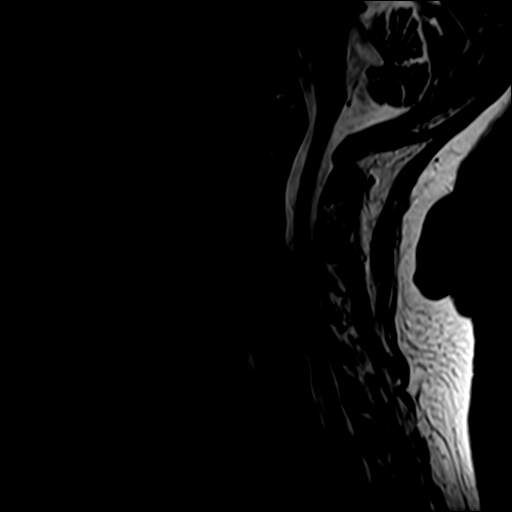
[im 12/15]
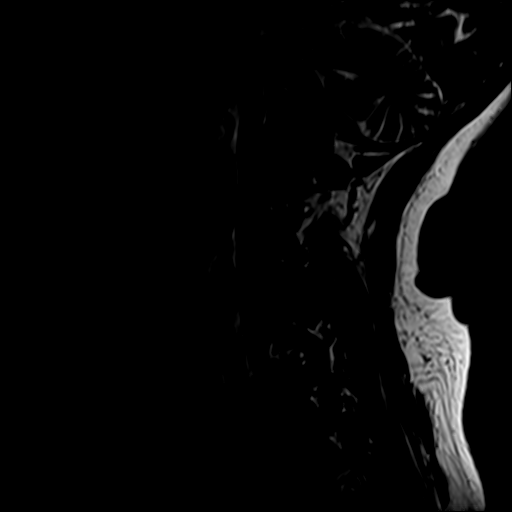
[im 15/15]
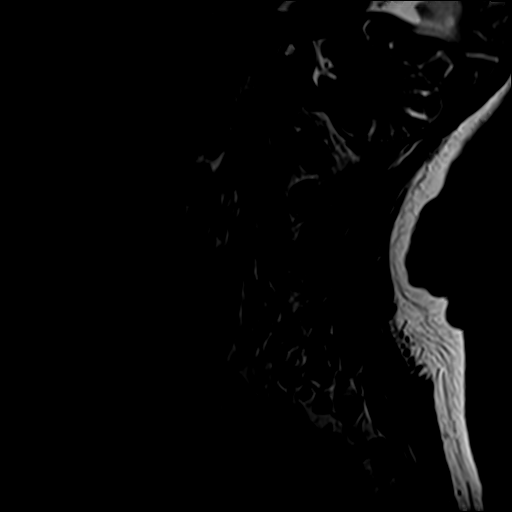

[Series 4: STIR · sagittal · 3.0mm · 0.82mm/px · 6 of 15 slices shown]
[im 1/15]
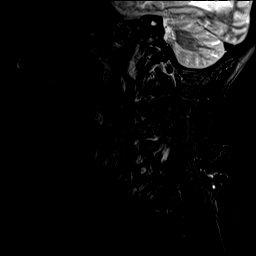
[im 3/15]
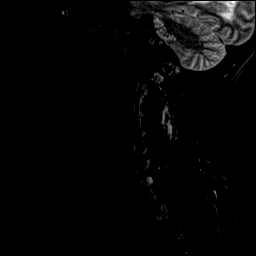
[im 6/15]
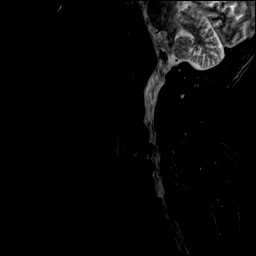
[im 9/15]
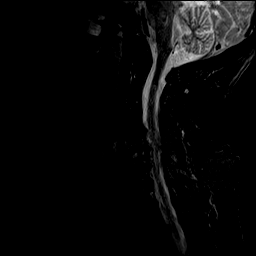
[im 12/15]
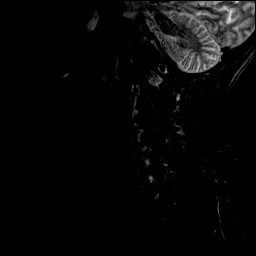
[im 15/15]
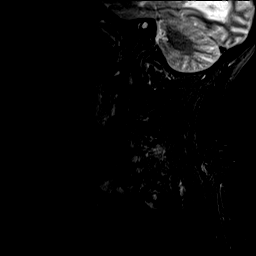

[Series 5: T1 · sagittal · 3.0mm · 0.82mm/px · 6 of 15 slices shown (1 of 2)]
[im 1/15]
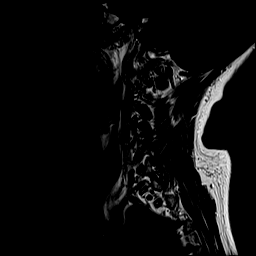
[im 3/15]
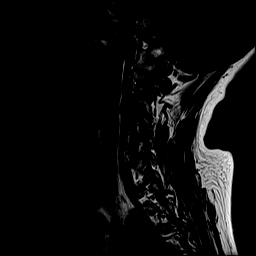
[im 6/15]
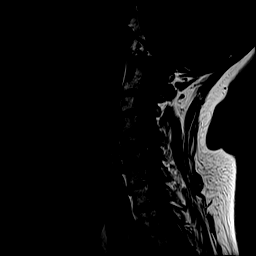
[im 9/15]
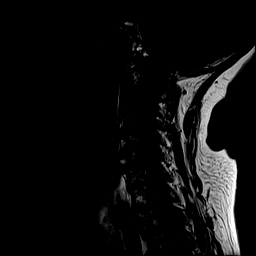
[im 12/15]
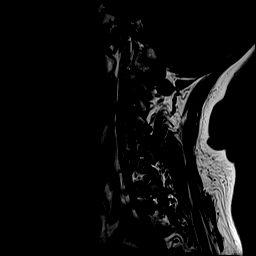
[im 15/15]
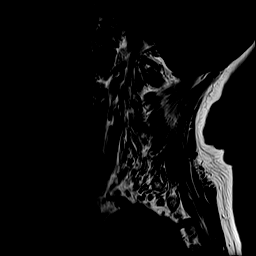

[Series 6: T2 · axial · 3.0mm · 0.70mm/px · z∈[-36,+63]mm · 9 of 28 slices shown (2 of 2)]
[im 1/28]
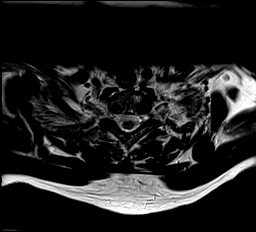
[im 5/28]
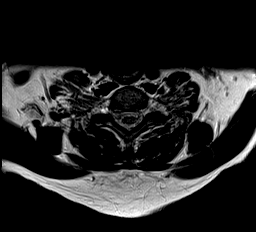
[im 8/28]
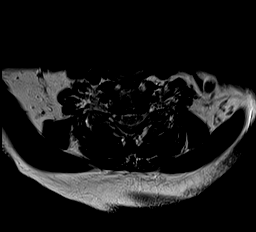
[im 13/28]
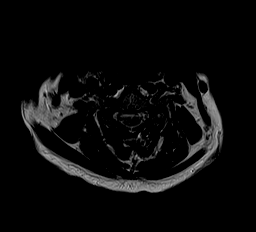
[im 15/28]
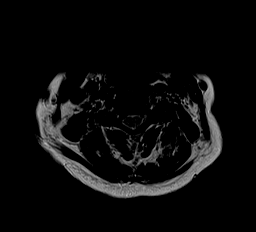
[im 20/28]
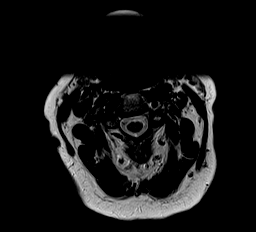
[im 23/28]
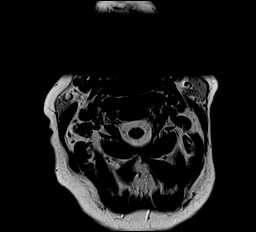
[im 25/28]
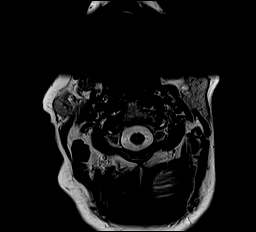
[im 28/28]
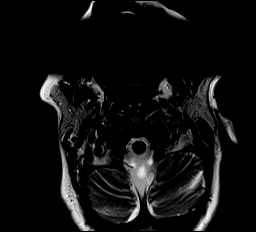

[Series 7: GRE · axial · 3.0mm · 0.35mm/px · z∈[-33,+11]mm · 4 of 28 slices shown]
[im 1/28]
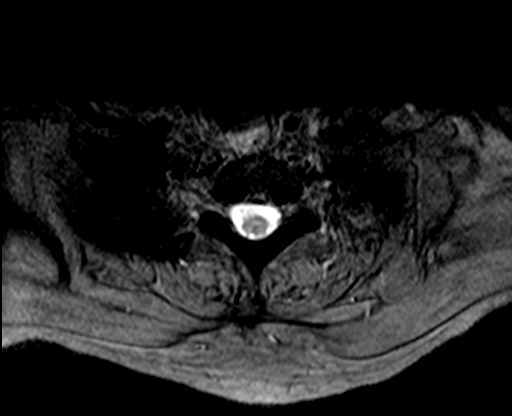
[im 5/28]
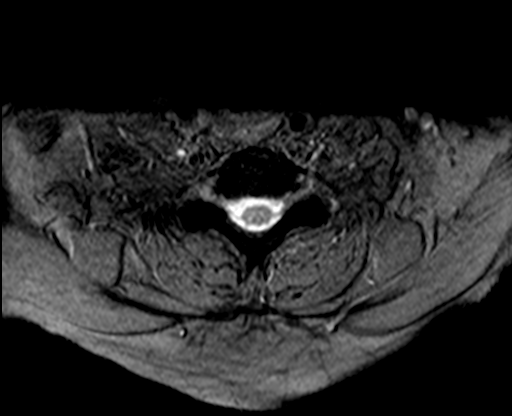
[im 8/28]
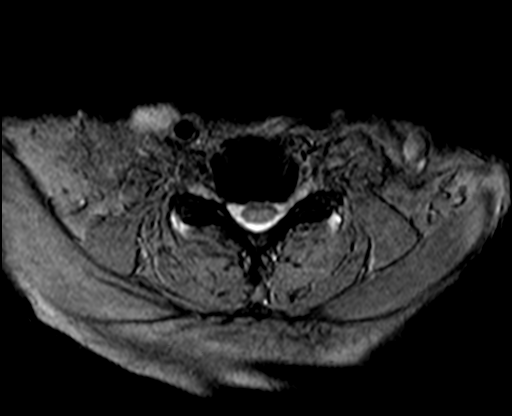
[im 13/28]
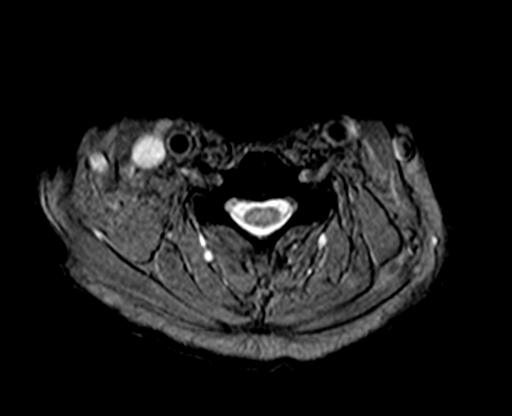

[Series 8: T1 · sagittal · 3.0mm · 1.25mm/px · 6 of 15 slices shown (2 of 2)]
[im 1/15]
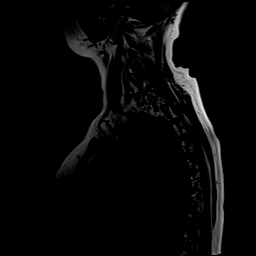
[im 3/15]
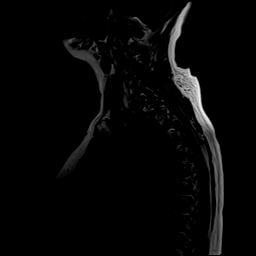
[im 6/15]
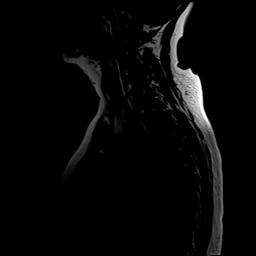
[im 9/15]
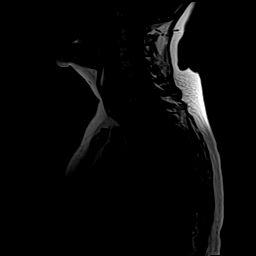
[im 12/15]
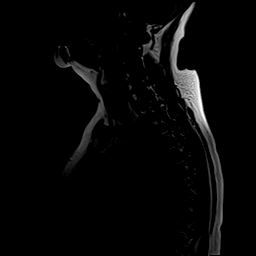
[im 15/15]
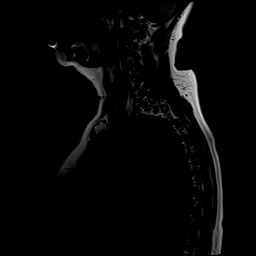

[37 of 48 positions shown; findings below may reference images not displayed]

FINDINGS: Please note that some image sequences are mildly degraded by motion
artifact.

MRI CERVICAL SPINE FINDINGS

Alignment: Straightening of lordosis. Minimal grade 1 C4-5
anterolisthesis.

Vertebrae: Mild Modic type 2 endplate degenerative changes. T1
hemangioma. Vertebral body heights are preserved.

Cord: Normal signal and morphology.

Posterior Fossa, vertebral arteries: Negative.

Disc levels: Multilevel desiccation.

C2-3: No significant disc bulge. Uncovertebral and facet
degenerative spurring. Patent spinal canal and neural foramen.

C3-4: Disc osteophyte complex with right predominant uncovertebral
and facet hypertrophy. Patent spinal canal and left neural foramen.
Moderate right neural foraminal narrowing.

C4-5: Disc osteophyte complex with shallow central protrusion,
uncovertebral and facet hypertrophy. Mild spinal canal and bilateral
neural foraminal narrowing.

C5-6: Disc osteophyte complex with superimposed right paracentral
protrusion abutting the ventral cord. Uncovertebral and facet
hypertrophy. Mild spinal canal and bilateral neural foraminal
narrowing.

C6-7: Disc osteophyte complex with superimposed central protrusion.
Uncovertebral and facet degenerative spurring. Mild spinal canal,
moderate right and mild left neural foraminal narrowing.

C7-T1: Mild disc bulge and uncovertebral/facet degenerative
spurring. Patent spinal canal and right neural foramen. Mild left
neural foraminal narrowing.

Paraspinal tissues: Negative.

MRI THORACIC SPINE FINDINGS

Alignment:  Normal.

Vertebrae: Vertebral body heights are preserved. Multilevel Modic
type 2 endplate degenerative changes with Schmorl node formation
most prominent at the T4-T10 levels. No focal osseous lesion.

Cord:  Normal signal and morphology.

Paraspinal and other soft tissues: Negative.

Disc levels:

Minimal posterior disc bulge at the T4-T10 levels. No significant
spinal canal narrowing.

Multilevel facet degenerative spurring. Mild bilateral T8-9, T9-10
neural foraminal narrowing. Mild right T2-3 neural foraminal
narrowing.

Neural foramen are patent at the remaining thoracic levels.
IMPRESSION: MRI cervical spine:

Multilevel spondylosis. Moderate right C3-4 and right C6-7 neural
foraminal narrowing.

Mild spinal canal and bilateral neural foraminal narrowing at the
C4-6 levels.

Mild C6-7 spinal canal and left neural foraminal narrowing. Mild
left C7-T1 neural foraminal narrowing.

Right C5-6 paracentral protrusion abutting the ventral cord. No
myelomalacia.

MRI thoracic spine:

Multilevel spondylosis predominantly involving the T4-T10 levels.

No significant spinal canal narrowing or cord impingement.

Mild right T2-3, bilateral T8-10 neural foraminal narrowing.

## 2020-12-09 IMAGING — MR MR CERVICAL SPINE W/O CM
4 of 5 series · 20 of 48 positions shown · non-contrast
Comparison: None.

CLINICAL DATA: Myelopathy, acute or progressive

EXAM:
MRI CERVICAL AND THORACIC SPINE WITHOUT CONTRAST
TECHNIQUE: Multiplanar and multiecho pulse sequences of the cervical spine, to
include the craniocervical junction and cervicothoracic junction,
and the thoracic spine, were obtained without intravenous contrast.

[Series 5: T2 · sagittal · 4.0mm · 0.59mm/px · 6 of 15 slices shown (1 of 3)]
[im 1/15]
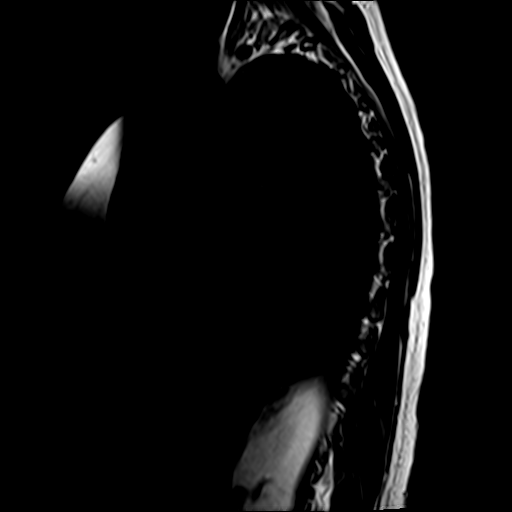
[im 3/15]
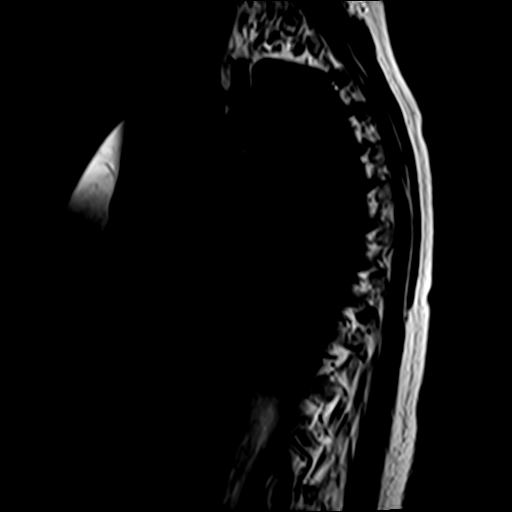
[im 6/15]
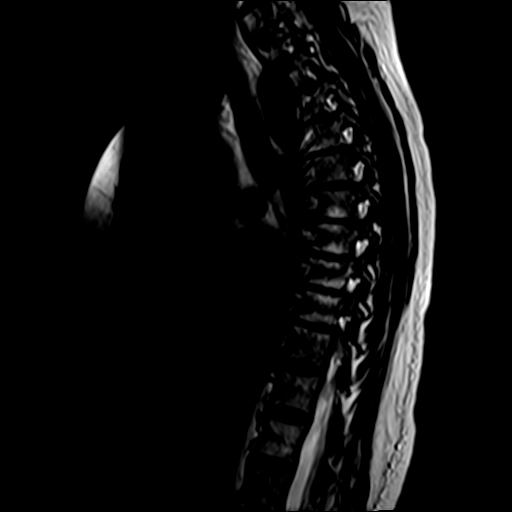
[im 9/15]
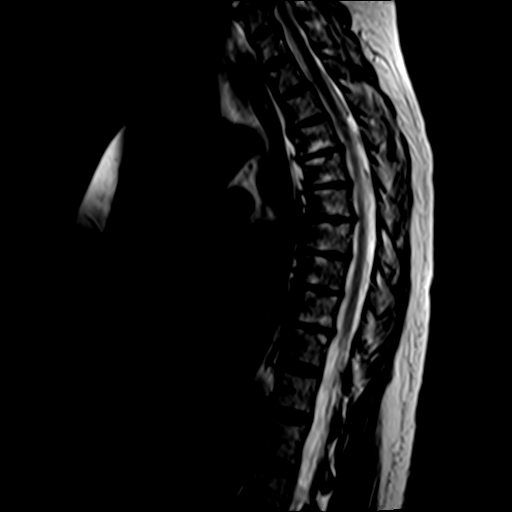
[im 12/15]
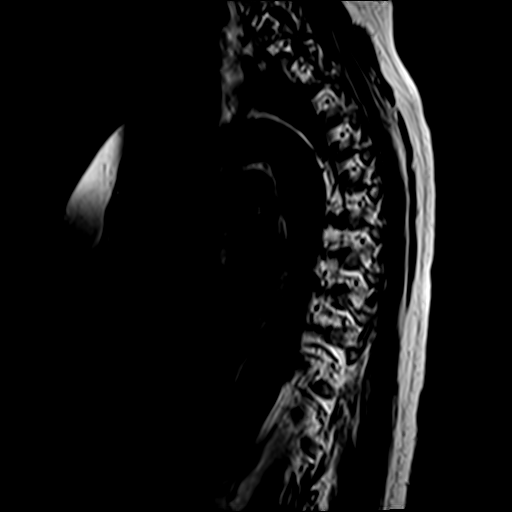
[im 15/15]
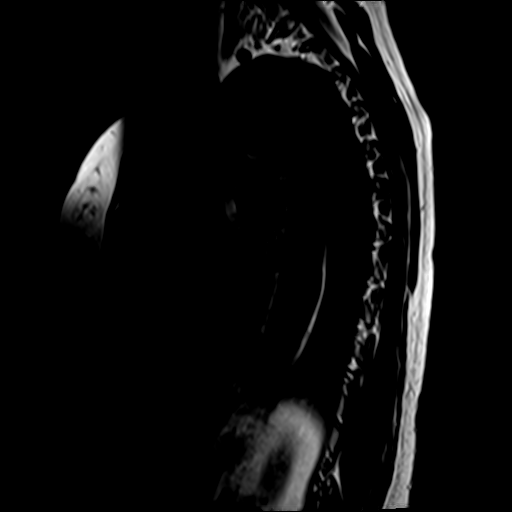

[Series 7: T1 · sagittal · 4.0mm · 1.17mm/px · 3 of 15 slices shown]
[im 3/15]
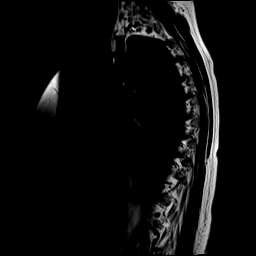
[im 9/15]
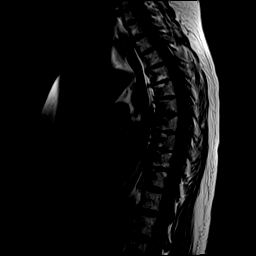
[im 15/15]
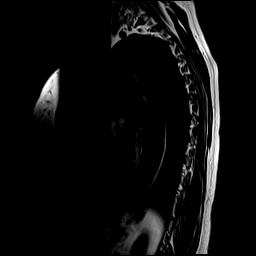

[Series 8: T2 · axial · 4.0mm · 0.39mm/px · z∈[-210,-51]mm · 8 of 36 slices shown (2 of 3)]
[im 1/36]
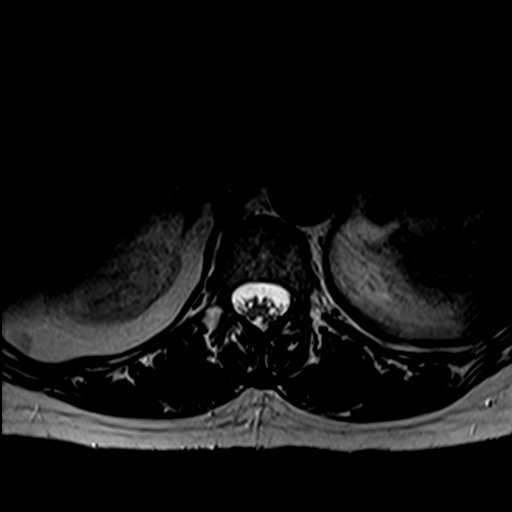
[im 6/36]
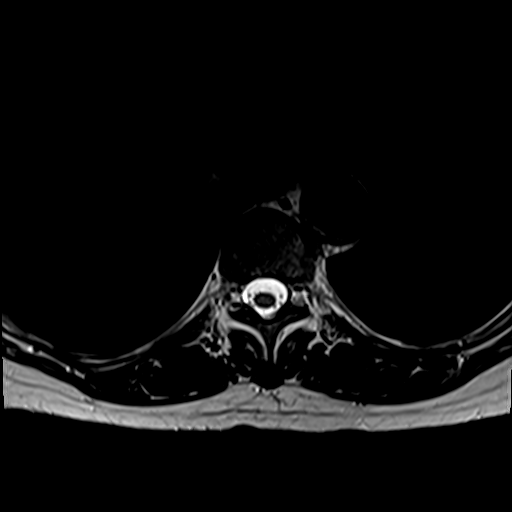
[im 11/36]
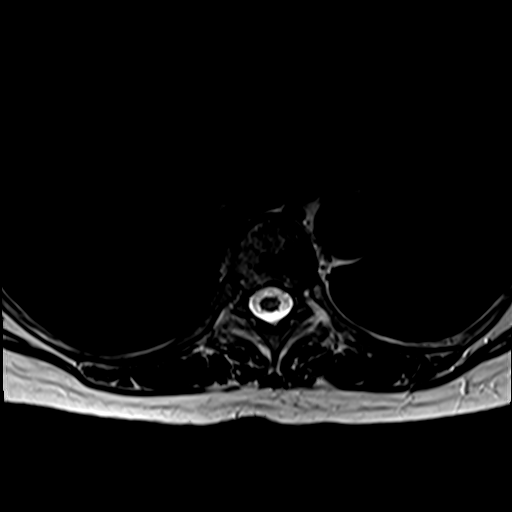
[im 16/36]
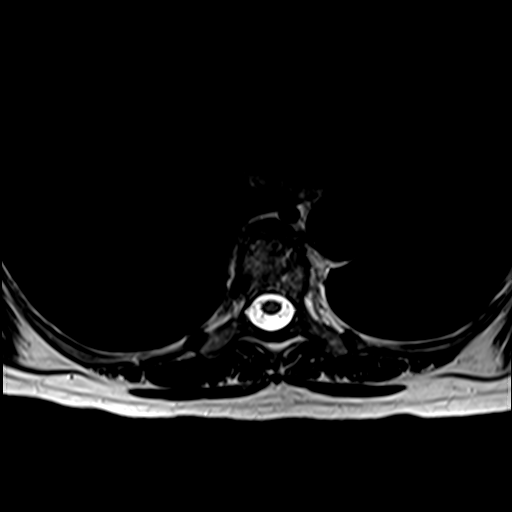
[im 18/36]
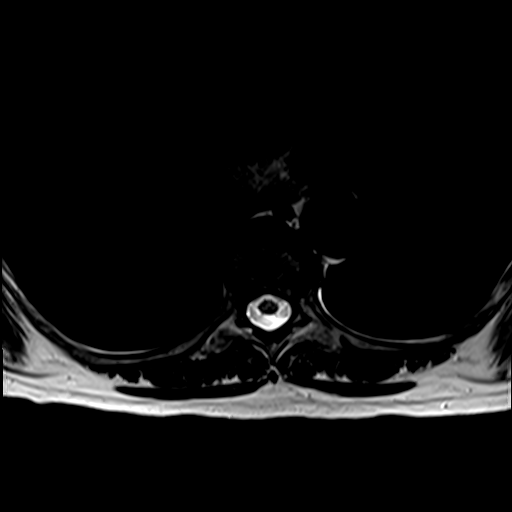
[im 21/36]
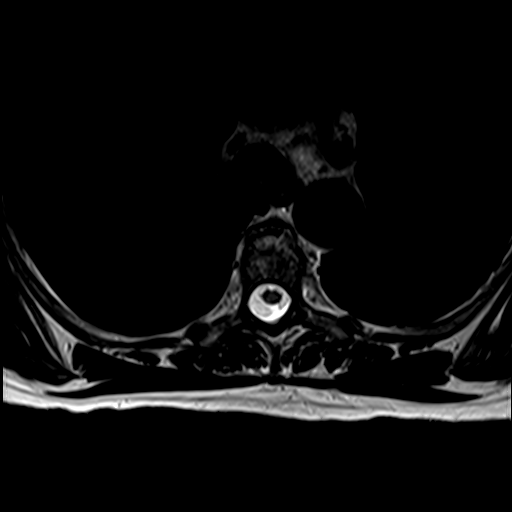
[im 26/36]
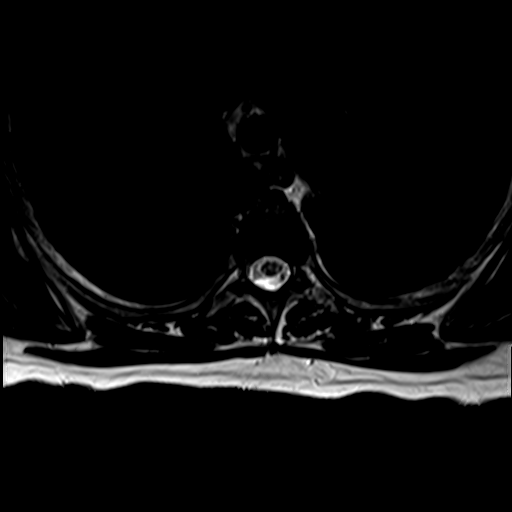
[im 31/36]
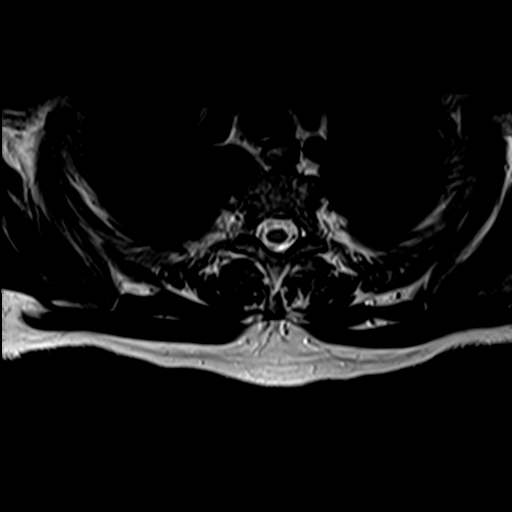

[Series 9: T2 · axial · 4.0mm · 0.39mm/px · z∈[-171,-51]mm · 3 of 36 slices shown (3 of 3)]
[im 6/36]
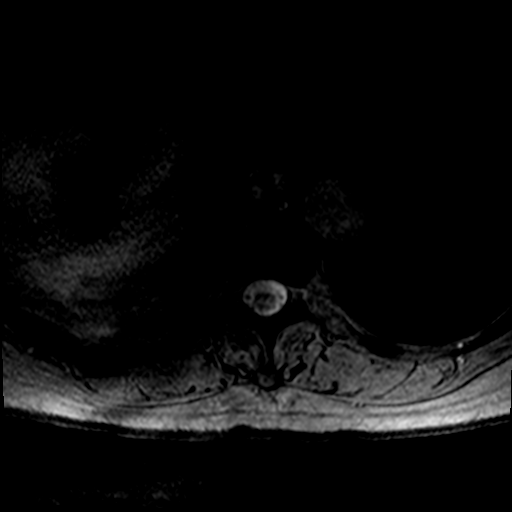
[im 18/36]
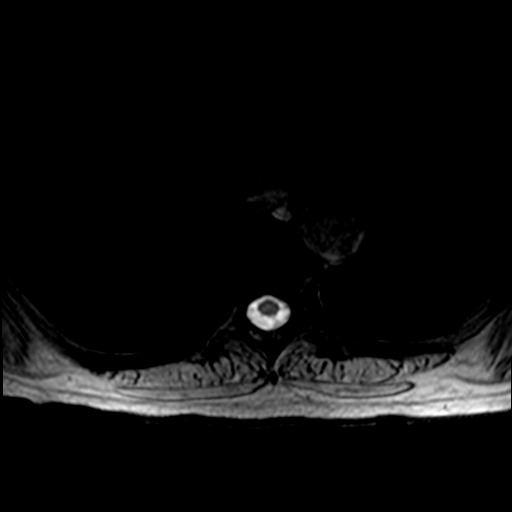
[im 31/36]
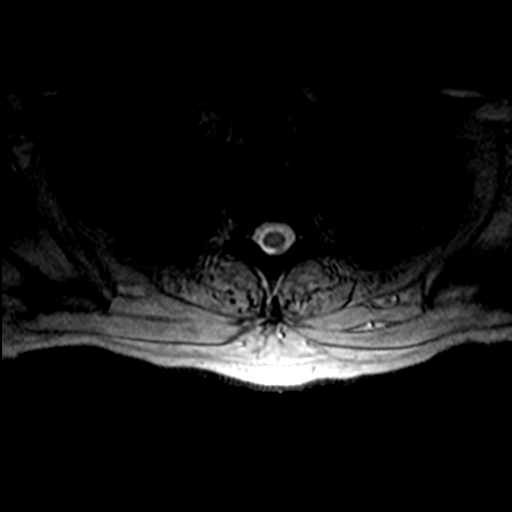

[20 of 48 positions shown; findings below may reference images not displayed]

FINDINGS: Please note that some image sequences are mildly degraded by motion
artifact.

MRI CERVICAL SPINE FINDINGS

Alignment: Straightening of lordosis. Minimal grade 1 C4-5
anterolisthesis.

Vertebrae: Mild Modic type 2 endplate degenerative changes. T1
hemangioma. Vertebral body heights are preserved.

Cord: Normal signal and morphology.

Posterior Fossa, vertebral arteries: Negative.

Disc levels: Multilevel desiccation.

C2-3: No significant disc bulge. Uncovertebral and facet
degenerative spurring. Patent spinal canal and neural foramen.

C3-4: Disc osteophyte complex with right predominant uncovertebral
and facet hypertrophy. Patent spinal canal and left neural foramen.
Moderate right neural foraminal narrowing.

C4-5: Disc osteophyte complex with shallow central protrusion,
uncovertebral and facet hypertrophy. Mild spinal canal and bilateral
neural foraminal narrowing.

C5-6: Disc osteophyte complex with superimposed right paracentral
protrusion abutting the ventral cord. Uncovertebral and facet
hypertrophy. Mild spinal canal and bilateral neural foraminal
narrowing.

C6-7: Disc osteophyte complex with superimposed central protrusion.
Uncovertebral and facet degenerative spurring. Mild spinal canal,
moderate right and mild left neural foraminal narrowing.

C7-T1: Mild disc bulge and uncovertebral/facet degenerative
spurring. Patent spinal canal and right neural foramen. Mild left
neural foraminal narrowing.

Paraspinal tissues: Negative.

MRI THORACIC SPINE FINDINGS

Alignment:  Normal.

Vertebrae: Vertebral body heights are preserved. Multilevel Modic
type 2 endplate degenerative changes with Schmorl node formation
most prominent at the T4-T10 levels. No focal osseous lesion.

Cord:  Normal signal and morphology.

Paraspinal and other soft tissues: Negative.

Disc levels:

Minimal posterior disc bulge at the T4-T10 levels. No significant
spinal canal narrowing.

Multilevel facet degenerative spurring. Mild bilateral T8-9, T9-10
neural foraminal narrowing. Mild right T2-3 neural foraminal
narrowing.

Neural foramen are patent at the remaining thoracic levels.
IMPRESSION: MRI cervical spine:

Multilevel spondylosis. Moderate right C3-4 and right C6-7 neural
foraminal narrowing.

Mild spinal canal and bilateral neural foraminal narrowing at the
C4-6 levels.

Mild C6-7 spinal canal and left neural foraminal narrowing. Mild
left C7-T1 neural foraminal narrowing.

Right C5-6 paracentral protrusion abutting the ventral cord. No
myelomalacia.

MRI thoracic spine:

Multilevel spondylosis predominantly involving the T4-T10 levels.

No significant spinal canal narrowing or cord impingement.

Mild right T2-3, bilateral T8-10 neural foraminal narrowing.

## 2020-12-13 ENCOUNTER — Other Ambulatory Visit: Payer: Self-pay

## 2020-12-13 ENCOUNTER — Telehealth: Payer: Self-pay | Admitting: Neurology

## 2020-12-13 ENCOUNTER — Ambulatory Visit: Payer: PPO | Admitting: Orthotics

## 2020-12-13 DIAGNOSIS — M79671 Pain in right foot: Secondary | ICD-10-CM

## 2020-12-13 DIAGNOSIS — M79672 Pain in left foot: Secondary | ICD-10-CM | POA: Diagnosis not present

## 2020-12-13 DIAGNOSIS — R2681 Unsteadiness on feet: Secondary | ICD-10-CM

## 2020-12-13 DIAGNOSIS — Z9181 History of falling: Secondary | ICD-10-CM

## 2020-12-13 DIAGNOSIS — G629 Polyneuropathy, unspecified: Secondary | ICD-10-CM

## 2020-12-13 NOTE — Progress Notes (Signed)
Patient came in today to pick up [name].   Patient was able to don brace independently and brace was check for fit to custom.   Patient was observed walking with brace and gait was improved as well as stability.   Patient was advised of care and wearing instructions.  Advised to notify practice if there were any issues; especially skin irritation.  

## 2020-12-13 NOTE — Telephone Encounter (Signed)
I called and discussed findings of MRI cervical and thoracic spine.  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  MRI thoracic spine with degenerative changes, no significant narrowing.  Her evaluation for exertional leg heaviness has been extensive including ABI, MRI lumbar spine and NCS/EMG of the legs which has been unrevealing.  I'm not sure if her cervical findings are enough to cause these symptoms, but it would explain her hyperreflexia.  I would like the opinion of neurosurgery, but she would like to wait to seek this until after she has seen Vein & Vascular.   Kaena Santori K. Allena Katz, DO

## 2020-12-27 DIAGNOSIS — I8312 Varicose veins of left lower extremity with inflammation: Secondary | ICD-10-CM | POA: Diagnosis not present

## 2021-01-03 DIAGNOSIS — I8311 Varicose veins of right lower extremity with inflammation: Secondary | ICD-10-CM | POA: Diagnosis not present

## 2021-01-03 DIAGNOSIS — R261 Paralytic gait: Secondary | ICD-10-CM | POA: Diagnosis not present

## 2021-01-03 DIAGNOSIS — I87393 Chronic venous hypertension (idiopathic) with other complications of bilateral lower extremity: Secondary | ICD-10-CM | POA: Diagnosis not present

## 2021-01-03 DIAGNOSIS — M6281 Muscle weakness (generalized): Secondary | ICD-10-CM | POA: Diagnosis not present

## 2021-01-19 ENCOUNTER — Telehealth: Payer: Self-pay | Admitting: Neurology

## 2021-01-19 DIAGNOSIS — M47812 Spondylosis without myelopathy or radiculopathy, cervical region: Secondary | ICD-10-CM

## 2021-01-19 NOTE — Telephone Encounter (Signed)
Called patient and informed patient that Providers do not usually call patients, but that I would be more than happy to take a message for her and send it to Dr. Allena Katz.   Patient stated that she wanted to let Dr. Allena Katz know that she went to the doctor who ran test on her veins and they came up with no answers. Patient would like to proceed with a Neurosurgeon referral.   Informed patient that I would relay this message to Dr. Allena Katz and call her once a referral is sent.

## 2021-01-19 NOTE — Telephone Encounter (Signed)
Patient called the Access nurse:  "Caller states she is requesting a call back from the physician when convenient. It is regarding a referral to a Neurosurgeon."

## 2021-01-20 ENCOUNTER — Other Ambulatory Visit: Payer: Self-pay

## 2021-01-20 DIAGNOSIS — M47812 Spondylosis without myelopathy or radiculopathy, cervical region: Secondary | ICD-10-CM

## 2021-01-20 NOTE — Telephone Encounter (Signed)
Referral created and sent to [facility].

## 2021-01-20 NOTE — Telephone Encounter (Signed)
Please send referral to neurosurgery for cervical spondylosis.

## 2021-01-24 DIAGNOSIS — I10 Essential (primary) hypertension: Secondary | ICD-10-CM | POA: Diagnosis not present

## 2021-01-24 DIAGNOSIS — Z6825 Body mass index (BMI) 25.0-25.9, adult: Secondary | ICD-10-CM | POA: Diagnosis not present

## 2021-01-24 DIAGNOSIS — M810 Age-related osteoporosis without current pathological fracture: Secondary | ICD-10-CM | POA: Diagnosis not present

## 2021-01-24 DIAGNOSIS — E039 Hypothyroidism, unspecified: Secondary | ICD-10-CM | POA: Diagnosis not present

## 2021-01-24 DIAGNOSIS — F17210 Nicotine dependence, cigarettes, uncomplicated: Secondary | ICD-10-CM | POA: Diagnosis not present

## 2021-01-24 DIAGNOSIS — F419 Anxiety disorder, unspecified: Secondary | ICD-10-CM | POA: Diagnosis not present

## 2021-01-24 DIAGNOSIS — R42 Dizziness and giddiness: Secondary | ICD-10-CM | POA: Diagnosis not present

## 2021-01-24 DIAGNOSIS — G629 Polyneuropathy, unspecified: Secondary | ICD-10-CM | POA: Diagnosis not present

## 2021-01-24 DIAGNOSIS — E7800 Pure hypercholesterolemia, unspecified: Secondary | ICD-10-CM | POA: Diagnosis not present

## 2021-01-30 DIAGNOSIS — I10 Essential (primary) hypertension: Secondary | ICD-10-CM | POA: Diagnosis not present

## 2021-01-30 DIAGNOSIS — M47812 Spondylosis without myelopathy or radiculopathy, cervical region: Secondary | ICD-10-CM | POA: Diagnosis not present

## 2021-01-30 DIAGNOSIS — Z6828 Body mass index (BMI) 28.0-28.9, adult: Secondary | ICD-10-CM | POA: Diagnosis not present

## 2021-02-07 ENCOUNTER — Telehealth: Payer: Self-pay | Admitting: Neurology

## 2021-02-07 DIAGNOSIS — R292 Abnormal reflex: Secondary | ICD-10-CM

## 2021-02-07 DIAGNOSIS — R29898 Other symptoms and signs involving the musculoskeletal system: Secondary | ICD-10-CM

## 2021-02-07 NOTE — Telephone Encounter (Signed)
Patient saw the neurosurgeon that Dr Allena Katz recommended she see. She states that the neurosurgeon was supposed to send Dr Allena Katz his response, but she hasn't heard anything back from either office. She would like to know if this is the "end of the road" or is there any other tests or specialists that Dr Allena Katz would recommend to try to figure out what's going on with her legs? Please call.

## 2021-02-08 NOTE — Telephone Encounter (Signed)
Advised pt of dr.Patel note and recommendation of a MRI of the Brain if she wanted to have that done.  Per pt do not wish to have a MRI at this time.

## 2021-02-08 NOTE — Telephone Encounter (Signed)
Neurosurgery did not see anything surgical stemming from the spine to be causing her symptoms.  Testing looking at her spine, nerves in the legs, and blood flow to the legs are all normal.  The only other testing that falls within the realm of neurology is a MRI brain.  It would be very atypical for her leg fatigue to be coming from a brain condition, but since we really do not have answers, MRI brain is the only other test that we have not done.  If agreeable, please order MRI brain wo contrast.

## 2021-02-13 ENCOUNTER — Other Ambulatory Visit: Payer: Self-pay | Admitting: *Deleted

## 2021-02-13 DIAGNOSIS — F17210 Nicotine dependence, cigarettes, uncomplicated: Secondary | ICD-10-CM

## 2021-02-13 DIAGNOSIS — Z87891 Personal history of nicotine dependence: Secondary | ICD-10-CM

## 2021-03-15 ENCOUNTER — Encounter: Payer: PPO | Admitting: Acute Care

## 2021-03-15 ENCOUNTER — Ambulatory Visit: Payer: PPO

## 2021-03-31 DIAGNOSIS — Z6825 Body mass index (BMI) 25.0-25.9, adult: Secondary | ICD-10-CM | POA: Diagnosis not present

## 2021-03-31 DIAGNOSIS — F419 Anxiety disorder, unspecified: Secondary | ICD-10-CM | POA: Diagnosis not present

## 2021-03-31 DIAGNOSIS — G629 Polyneuropathy, unspecified: Secondary | ICD-10-CM | POA: Diagnosis not present

## 2021-03-31 DIAGNOSIS — F17210 Nicotine dependence, cigarettes, uncomplicated: Secondary | ICD-10-CM | POA: Diagnosis not present

## 2021-04-24 ENCOUNTER — Telehealth: Payer: Self-pay | Admitting: Acute Care

## 2021-05-02 NOTE — Telephone Encounter (Signed)
Will close this message and refer to referral notes 

## 2021-05-05 DIAGNOSIS — F419 Anxiety disorder, unspecified: Secondary | ICD-10-CM | POA: Diagnosis not present

## 2021-05-05 DIAGNOSIS — F17210 Nicotine dependence, cigarettes, uncomplicated: Secondary | ICD-10-CM | POA: Diagnosis not present

## 2021-06-27 DIAGNOSIS — G992 Myelopathy in diseases classified elsewhere: Secondary | ICD-10-CM | POA: Diagnosis not present

## 2021-06-27 DIAGNOSIS — R531 Weakness: Secondary | ICD-10-CM | POA: Diagnosis not present

## 2021-06-27 DIAGNOSIS — G63 Polyneuropathy in diseases classified elsewhere: Secondary | ICD-10-CM | POA: Diagnosis not present

## 2021-06-27 DIAGNOSIS — R296 Repeated falls: Secondary | ICD-10-CM | POA: Diagnosis not present

## 2021-06-27 DIAGNOSIS — R209 Unspecified disturbances of skin sensation: Secondary | ICD-10-CM | POA: Diagnosis not present

## 2021-06-27 DIAGNOSIS — E610 Copper deficiency: Secondary | ICD-10-CM | POA: Diagnosis not present

## 2021-07-18 DIAGNOSIS — Z0000 Encounter for general adult medical examination without abnormal findings: Secondary | ICD-10-CM | POA: Diagnosis not present

## 2021-07-18 DIAGNOSIS — Z1389 Encounter for screening for other disorder: Secondary | ICD-10-CM | POA: Diagnosis not present

## 2021-07-19 DIAGNOSIS — E039 Hypothyroidism, unspecified: Secondary | ICD-10-CM | POA: Diagnosis not present

## 2021-07-19 DIAGNOSIS — R7303 Prediabetes: Secondary | ICD-10-CM | POA: Diagnosis not present

## 2021-07-19 DIAGNOSIS — I10 Essential (primary) hypertension: Secondary | ICD-10-CM | POA: Diagnosis not present

## 2021-07-19 DIAGNOSIS — E7800 Pure hypercholesterolemia, unspecified: Secondary | ICD-10-CM | POA: Diagnosis not present

## 2021-07-19 DIAGNOSIS — R29898 Other symptoms and signs involving the musculoskeletal system: Secondary | ICD-10-CM | POA: Diagnosis not present

## 2021-07-19 DIAGNOSIS — E559 Vitamin D deficiency, unspecified: Secondary | ICD-10-CM | POA: Diagnosis not present

## 2021-07-19 DIAGNOSIS — M810 Age-related osteoporosis without current pathological fracture: Secondary | ICD-10-CM | POA: Diagnosis not present

## 2021-07-19 DIAGNOSIS — F419 Anxiety disorder, unspecified: Secondary | ICD-10-CM | POA: Diagnosis not present

## 2021-08-03 DIAGNOSIS — R29898 Other symptoms and signs involving the musculoskeletal system: Secondary | ICD-10-CM | POA: Diagnosis not present

## 2021-08-08 DIAGNOSIS — M25561 Pain in right knee: Secondary | ICD-10-CM | POA: Diagnosis not present

## 2021-08-08 DIAGNOSIS — M25562 Pain in left knee: Secondary | ICD-10-CM | POA: Diagnosis not present

## 2021-08-23 DIAGNOSIS — M25562 Pain in left knee: Secondary | ICD-10-CM | POA: Diagnosis not present

## 2021-08-23 DIAGNOSIS — M6281 Muscle weakness (generalized): Secondary | ICD-10-CM | POA: Diagnosis not present

## 2021-08-23 DIAGNOSIS — M25561 Pain in right knee: Secondary | ICD-10-CM | POA: Diagnosis not present

## 2021-08-23 DIAGNOSIS — R262 Difficulty in walking, not elsewhere classified: Secondary | ICD-10-CM | POA: Diagnosis not present

## 2021-08-25 DIAGNOSIS — R29898 Other symptoms and signs involving the musculoskeletal system: Secondary | ICD-10-CM | POA: Diagnosis not present

## 2021-08-30 DIAGNOSIS — M6281 Muscle weakness (generalized): Secondary | ICD-10-CM | POA: Diagnosis not present

## 2021-08-30 DIAGNOSIS — M25561 Pain in right knee: Secondary | ICD-10-CM | POA: Diagnosis not present

## 2021-08-30 DIAGNOSIS — M25562 Pain in left knee: Secondary | ICD-10-CM | POA: Diagnosis not present

## 2021-08-30 DIAGNOSIS — R262 Difficulty in walking, not elsewhere classified: Secondary | ICD-10-CM | POA: Diagnosis not present

## 2021-09-01 DIAGNOSIS — M6281 Muscle weakness (generalized): Secondary | ICD-10-CM | POA: Diagnosis not present

## 2021-09-01 DIAGNOSIS — M25561 Pain in right knee: Secondary | ICD-10-CM | POA: Diagnosis not present

## 2021-09-01 DIAGNOSIS — M25562 Pain in left knee: Secondary | ICD-10-CM | POA: Diagnosis not present

## 2021-09-01 DIAGNOSIS — R262 Difficulty in walking, not elsewhere classified: Secondary | ICD-10-CM | POA: Diagnosis not present

## 2021-09-05 DIAGNOSIS — M25562 Pain in left knee: Secondary | ICD-10-CM | POA: Diagnosis not present

## 2021-09-05 DIAGNOSIS — R262 Difficulty in walking, not elsewhere classified: Secondary | ICD-10-CM | POA: Diagnosis not present

## 2021-09-05 DIAGNOSIS — M6281 Muscle weakness (generalized): Secondary | ICD-10-CM | POA: Diagnosis not present

## 2021-09-05 DIAGNOSIS — M25561 Pain in right knee: Secondary | ICD-10-CM | POA: Diagnosis not present

## 2021-09-08 DIAGNOSIS — M6281 Muscle weakness (generalized): Secondary | ICD-10-CM | POA: Diagnosis not present

## 2021-09-08 DIAGNOSIS — R262 Difficulty in walking, not elsewhere classified: Secondary | ICD-10-CM | POA: Diagnosis not present

## 2021-09-08 DIAGNOSIS — M25562 Pain in left knee: Secondary | ICD-10-CM | POA: Diagnosis not present

## 2021-09-08 DIAGNOSIS — M25561 Pain in right knee: Secondary | ICD-10-CM | POA: Diagnosis not present

## 2021-09-12 DIAGNOSIS — G259 Extrapyramidal and movement disorder, unspecified: Secondary | ICD-10-CM | POA: Diagnosis not present

## 2021-09-12 DIAGNOSIS — M6281 Muscle weakness (generalized): Secondary | ICD-10-CM | POA: Diagnosis not present

## 2021-09-12 DIAGNOSIS — M25561 Pain in right knee: Secondary | ICD-10-CM | POA: Diagnosis not present

## 2021-09-12 DIAGNOSIS — M25562 Pain in left knee: Secondary | ICD-10-CM | POA: Diagnosis not present

## 2021-09-12 DIAGNOSIS — R2689 Other abnormalities of gait and mobility: Secondary | ICD-10-CM | POA: Diagnosis not present

## 2021-09-12 DIAGNOSIS — R262 Difficulty in walking, not elsewhere classified: Secondary | ICD-10-CM | POA: Diagnosis not present

## 2021-09-15 DIAGNOSIS — M6281 Muscle weakness (generalized): Secondary | ICD-10-CM | POA: Diagnosis not present

## 2021-09-15 DIAGNOSIS — M25562 Pain in left knee: Secondary | ICD-10-CM | POA: Diagnosis not present

## 2021-09-15 DIAGNOSIS — R262 Difficulty in walking, not elsewhere classified: Secondary | ICD-10-CM | POA: Diagnosis not present

## 2021-09-15 DIAGNOSIS — M25561 Pain in right knee: Secondary | ICD-10-CM | POA: Diagnosis not present

## 2021-09-19 DIAGNOSIS — M25561 Pain in right knee: Secondary | ICD-10-CM | POA: Diagnosis not present

## 2021-09-19 DIAGNOSIS — M6281 Muscle weakness (generalized): Secondary | ICD-10-CM | POA: Diagnosis not present

## 2021-09-19 DIAGNOSIS — R262 Difficulty in walking, not elsewhere classified: Secondary | ICD-10-CM | POA: Diagnosis not present

## 2021-09-19 DIAGNOSIS — M25562 Pain in left knee: Secondary | ICD-10-CM | POA: Diagnosis not present

## 2021-09-21 DIAGNOSIS — M25562 Pain in left knee: Secondary | ICD-10-CM | POA: Diagnosis not present

## 2021-09-21 DIAGNOSIS — M6281 Muscle weakness (generalized): Secondary | ICD-10-CM | POA: Diagnosis not present

## 2021-09-21 DIAGNOSIS — M25561 Pain in right knee: Secondary | ICD-10-CM | POA: Diagnosis not present

## 2021-09-21 DIAGNOSIS — R262 Difficulty in walking, not elsewhere classified: Secondary | ICD-10-CM | POA: Diagnosis not present

## 2021-09-26 DIAGNOSIS — R262 Difficulty in walking, not elsewhere classified: Secondary | ICD-10-CM | POA: Diagnosis not present

## 2021-09-26 DIAGNOSIS — M25561 Pain in right knee: Secondary | ICD-10-CM | POA: Diagnosis not present

## 2021-09-26 DIAGNOSIS — M6281 Muscle weakness (generalized): Secondary | ICD-10-CM | POA: Diagnosis not present

## 2021-09-26 DIAGNOSIS — M25562 Pain in left knee: Secondary | ICD-10-CM | POA: Diagnosis not present

## 2021-09-29 DIAGNOSIS — R9082 White matter disease, unspecified: Secondary | ICD-10-CM | POA: Diagnosis not present

## 2021-09-29 DIAGNOSIS — G259 Extrapyramidal and movement disorder, unspecified: Secondary | ICD-10-CM | POA: Diagnosis not present

## 2021-09-29 DIAGNOSIS — G939 Disorder of brain, unspecified: Secondary | ICD-10-CM | POA: Diagnosis not present

## 2021-09-29 DIAGNOSIS — J320 Chronic maxillary sinusitis: Secondary | ICD-10-CM | POA: Diagnosis not present

## 2021-10-10 DIAGNOSIS — M1712 Unilateral primary osteoarthritis, left knee: Secondary | ICD-10-CM | POA: Diagnosis not present

## 2021-10-23 DIAGNOSIS — M5116 Intervertebral disc disorders with radiculopathy, lumbar region: Secondary | ICD-10-CM | POA: Diagnosis not present

## 2021-11-14 DIAGNOSIS — M4312 Spondylolisthesis, cervical region: Secondary | ICD-10-CM | POA: Diagnosis not present

## 2021-11-14 DIAGNOSIS — R29890 Loss of height: Secondary | ICD-10-CM | POA: Diagnosis not present

## 2021-11-14 DIAGNOSIS — M47816 Spondylosis without myelopathy or radiculopathy, lumbar region: Secondary | ICD-10-CM | POA: Diagnosis not present

## 2021-11-14 DIAGNOSIS — M2578 Osteophyte, vertebrae: Secondary | ICD-10-CM | POA: Diagnosis not present

## 2021-11-16 ENCOUNTER — Ambulatory Visit (INDEPENDENT_AMBULATORY_CARE_PROVIDER_SITE_OTHER): Payer: PPO | Admitting: Acute Care

## 2021-11-16 ENCOUNTER — Other Ambulatory Visit: Payer: Self-pay

## 2021-11-16 ENCOUNTER — Encounter: Payer: Self-pay | Admitting: Acute Care

## 2021-11-16 DIAGNOSIS — F17210 Nicotine dependence, cigarettes, uncomplicated: Secondary | ICD-10-CM | POA: Diagnosis not present

## 2021-11-16 NOTE — Progress Notes (Signed)
Virtual Visit via Telephone Note  I connected with Kendra Mitchell on 10/17/21 at  2:00 PM EST by telephone and verified that I am speaking with the correct person using two identifiers.  Location: Patient: Home Provider: Working from home   I discussed the limitations, risks, security and privacy concerns of performing an evaluation and management service by telephone and the availability of in person appointments. I also discussed with the patient that there may be a patient responsible charge related to this service. The patient expressed understanding and agreed to proceed.  Shared Decision Making Visit Lung Cancer Screening Program [phone])   Eligibility: Age 75 y.o. Pack Years Smoking History Calculation 44 (# packs/per year x # years smoked) Recent History of coughing up blood  no Unexplained weight loss? no ( >Than 15 pounds within the last 6 months ) Prior History Lung / other cancer no (Diagnosis within the last 5 years already requiring surveillance chest CT Scans). Smoking Status Current Smoker Former Smokers: Years since quit: NA  Quit Date: NA  Visit Components: Discussion included one or more decision making aids. yes Discussion included risk/benefits of screening. yes Discussion included potential follow up diagnostic testing for abnormal scans. yes Discussion included meaning and risk of over diagnosis. yes Discussion included meaning and risk of False Positives. yes Discussion included meaning of total radiation exposure. yes  Counseling Included: Importance of adherence to annual lung cancer LDCT screening. yes Impact of comorbidities on ability to participate in the program. yes Ability and willingness to under diagnostic treatment. yes  Smoking Cessation Counseling: Current Smokers:  Discussed importance of smoking cessation. yes Information about tobacco cessation classes and interventions provided to patient. yes Patient provided with "ticket" for  LDCT Scan. yes Symptomatic Patient. yes  Counseling Unwilling to discuss consulting  Diagnosis Code: Tobacco Use Z72.0 Asymptomatic Patient NA  Counseling NA Former Smokers:  Discussed the importance of maintaining cigarette abstinence. yes Diagnosis Code: Personal History of Nicotine Dependence. C58.527 Information about tobacco cessation classes and interventions provided to patient. Yes Patient provided with "ticket" for LDCT Scan. yes Written Order for Lung Cancer Screening with LDCT placed in Epic. Yes (CT Chest Lung Cancer Screening Low Dose W/O CM) [id number] Z12.2-Screening of respiratory organs Z87.891-Personal history of nicotine dependence   I spent 25 minutes of face to face time with her discussing the risks and benefits of lung cancer screening. We viewed a power point together that explained in detail the above noted topics. We took the time to pause the power point at intervals to allow for questions to be asked and answered to ensure understanding. We discussed that she had taken the single most powerful action possible to decrease her risk of developing lung cancer when he quit smoking. I counseled her to remain smoke free, and to contact me if she ever had the desire to smoke again so that I can provide resources and tools to help support the effort to remain smoke free. We discussed the time and location of the scan, and that either  Kendra Miyamoto RN or I will call with the results within  24-48 hours of receiving them. She has my card and contact information in the event she needs to speak with me, in addition to a copy of the power point we reviewed as a resource. She verbalized understanding of all of the above and had no further questions upon leaving the office.    I explained to the patient that there has been a high  incidence of coronary artery disease noted on these exams. I explained that this is a non-gated exam therefore degree or severity cannot be determined. This  patient is not on statin therapy. I have asked the patient to follow-up with their PCP regarding any incidental finding of coronary artery disease and management with diet or medication as they feel is clinically indicated. The patient verbalized understanding of the above and had no further questions.   Kendra Mitchell D. Tiburcio Pea, NP-C [facility] Personal contact information can be found on Amion  11/16/2021, 11:25 AM

## 2021-11-16 NOTE — Patient Instructions (Signed)
Thank you for participating in the [facility]. °It was our pleasure to meet you today. °We will call you with the results of your scan within the next few days. °Your scan will be assigned a Lung RADS category score by the physicians reading the scans.  °This Lung RADS score determines follow up scanning.  °See below for description of categories, and follow up screening recommendations. °We will be in touch to schedule your follow up screening annually or based on recommendations of our providers. °We will fax a copy of your scan results to your Primary Care Physician, or the physician who referred you to the program, to ensure they have the results. °Please call the office if you have any questions or concerns regarding your scanning experience or results.  °Our office number is [phone]. °Please speak with Denise Phelps, RN. She is our Lung Cancer Screening RN. °If she is unavailable when you call, please have the office staff send her a message. She will return your call at her earliest convenience. °Remember, if your scan is normal, we will scan you annually as long as you continue to meet the criteria for the program. (Age 55-77, Current smoker or smoker who has quit within the last 15 years). °If you are a smoker, remember, quitting is the single most powerful action that you can take to decrease your risk of lung cancer and other pulmonary, breathing related problems. °We know quitting is hard, and we are here to help.  °Please let us know if there is anything we can do to help you meet your goal of quitting. °If you are a former smoker, congratulations. We are proud of you! Remain smoke free! °Remember you can refer friends or family members through the number above.  °We will screen them to make sure they meet criteria for the program. °Thank you for helping us take better care of you by participating in Lung Screening. ° °You can receive free nicotine replacement therapy  ( patches, gum or mints) by calling 1-800-QUIT NOW. Please call so we can get you on the path to becoming  a non-smoker. I know it is hard, but you can do this! ° °Lung RADS Categories: ° °Lung RADS 1: no nodules or definitely non-concerning nodules.  °Recommendation is for a repeat annual scan in 12 months. ° °Lung RADS 2:  nodules that are non-concerning in appearance and behavior with a very low likelihood of becoming an active cancer. °Recommendation is for a repeat annual scan in 12 months. ° °Lung RADS 3: nodules that are probably non-concerning , includes nodules with a low likelihood of becoming an active cancer.  Recommendation is for a 6-month repeat screening scan. Often noted after an upper respiratory illness. We will be in touch to make sure you have no questions, and to schedule your 6-month scan. ° °Lung RADS 4 A: nodules with concerning findings, recommendation is most often for a follow up scan in 3 months or additional testing based on our provider's assessment of the scan. We will be in touch to make sure you have no questions and to schedule the recommended 3 month follow up scan. ° °Lung RADS 4 B:  indicates findings that are concerning. We will be in touch with you to schedule additional diagnostic testing based on our provider's  assessment of the scan. ° °Hypnosis for smoking cessation  °[employer] °[phone] ° °Acupuncture for smoking cessation  °[facility] °[phone]  °

## 2021-11-17 ENCOUNTER — Ambulatory Visit
Admission: RE | Admit: 2021-11-17 | Discharge: 2021-11-17 | Disposition: A | Discharge status: Home / Self Care | Payer: PPO | Source: Ambulatory Visit | Attending: Acute Care | Admitting: Acute Care

## 2021-11-17 ENCOUNTER — Other Ambulatory Visit: Payer: Self-pay

## 2021-11-17 DIAGNOSIS — Z87891 Personal history of nicotine dependence: Secondary | ICD-10-CM

## 2021-11-17 DIAGNOSIS — F17210 Nicotine dependence, cigarettes, uncomplicated: Secondary | ICD-10-CM

## 2021-11-17 IMAGING — CT CT CHEST LUNG CANCER SCREENING LOW DOSE W/O CM
1 of 3 series · 9 of 40 positions shown, 12 images · non-contrast
Comparison: None.

CLINICAL DATA: Lung cancer screening. Forty-four pack-year history.
Current asymptomatic smoker.

EXAM:
CT CHEST WITHOUT CONTRAST LOW-DOSE FOR LUNG CANCER SCREENING
TECHNIQUE: Multidetector CT imaging of the chest was performed following the
standard protocol without IV contrast.

[ct lung segmentation data · axial · 0.62mm/px · z∈[+1236,+1236]mm · 9 of 311 frames shown]
[frame 1/311  mediastinal]
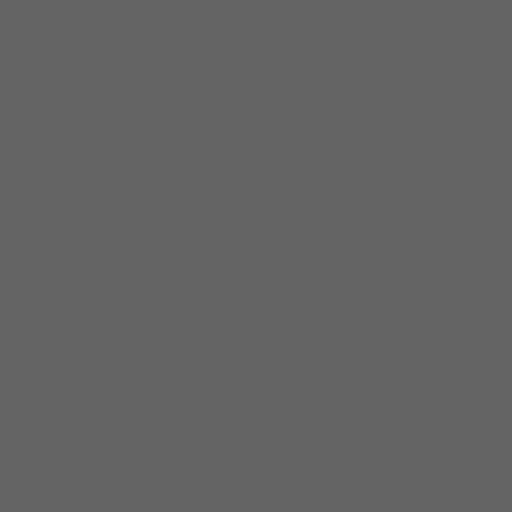
[frame 1/311  lung]
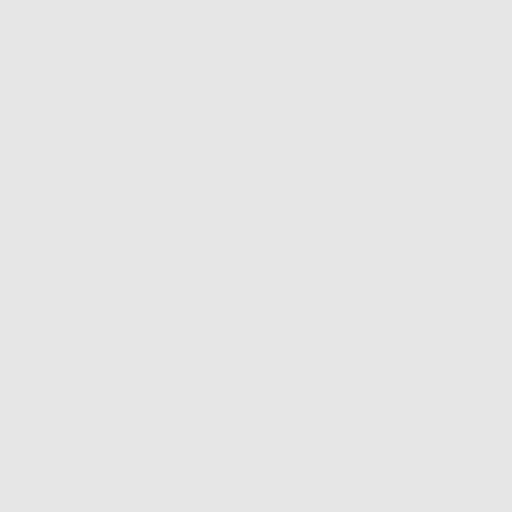
[frame 35/311  lung]
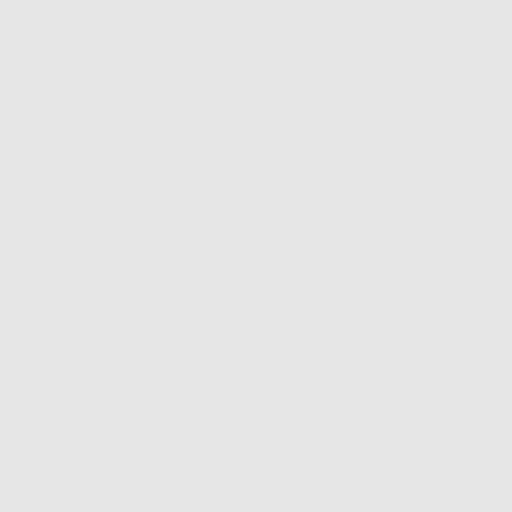
[frame 69/311  lung]
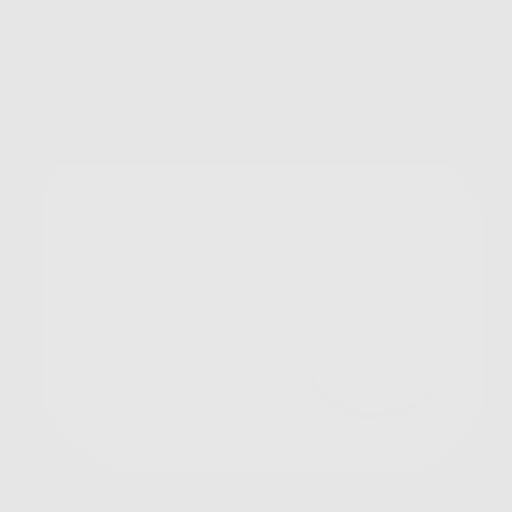
[frame 104/311  lung]
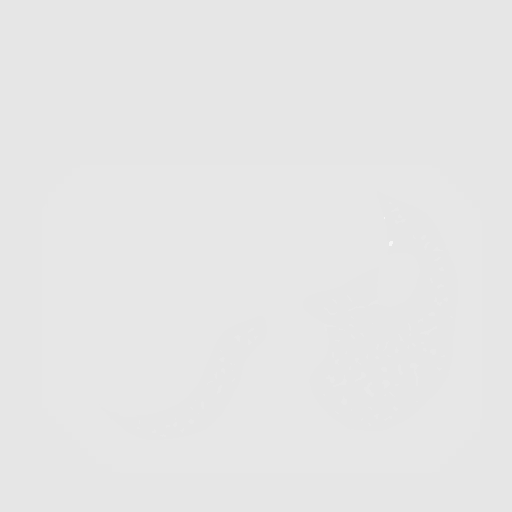
[frame 138/311  mediastinal]
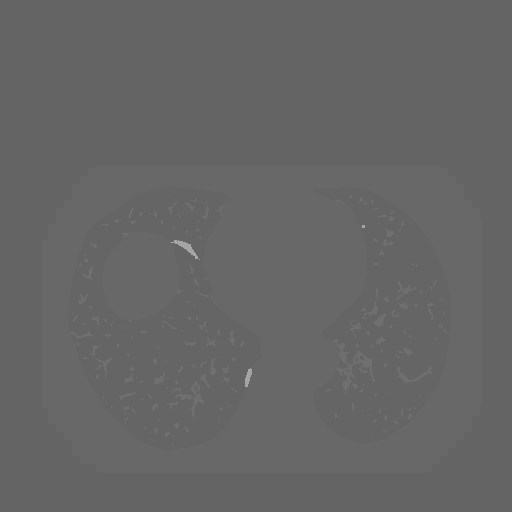
[frame 138/311  lung]
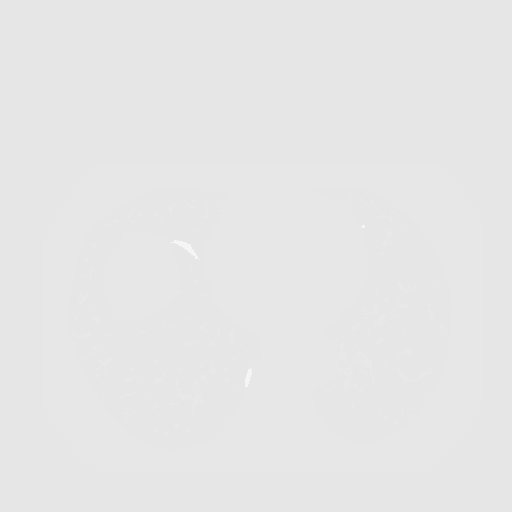
[frame 173/311  lung]
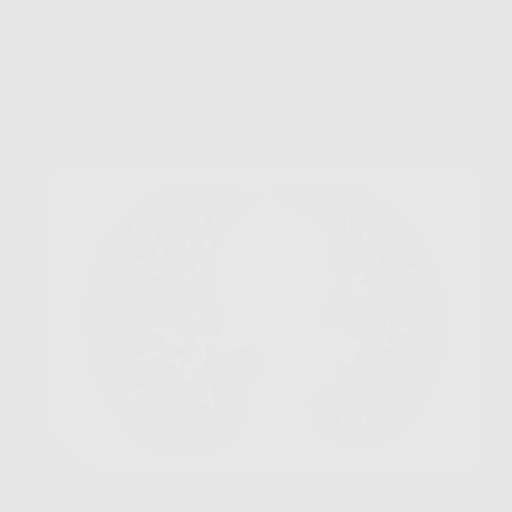
[frame 207/311  lung]
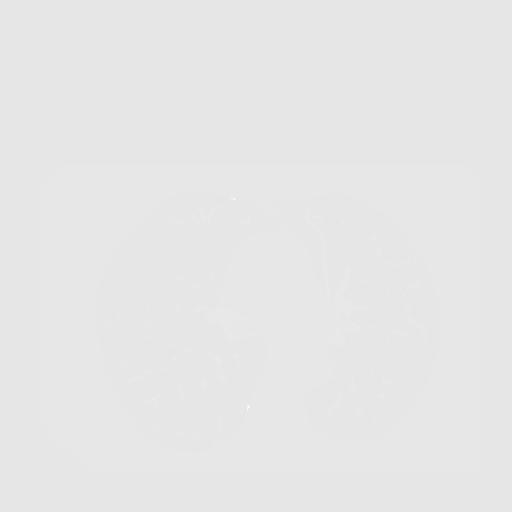
[frame 242/311  lung]
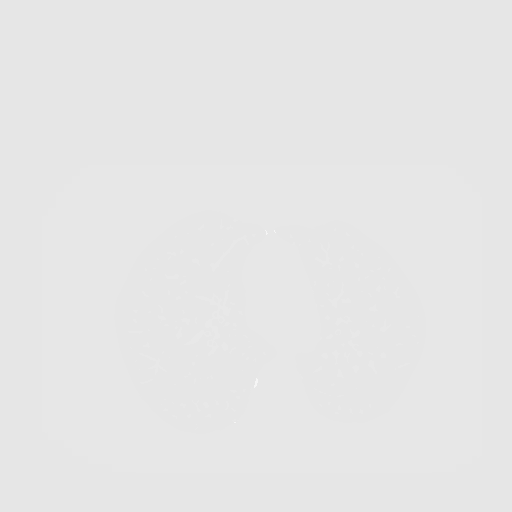
[frame 276/311  mediastinal]
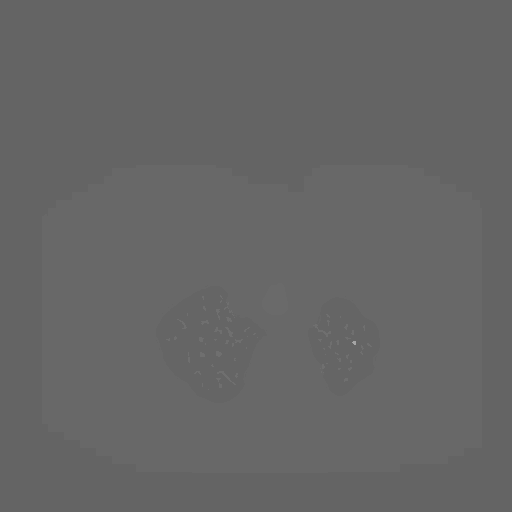
[frame 276/311  lung]
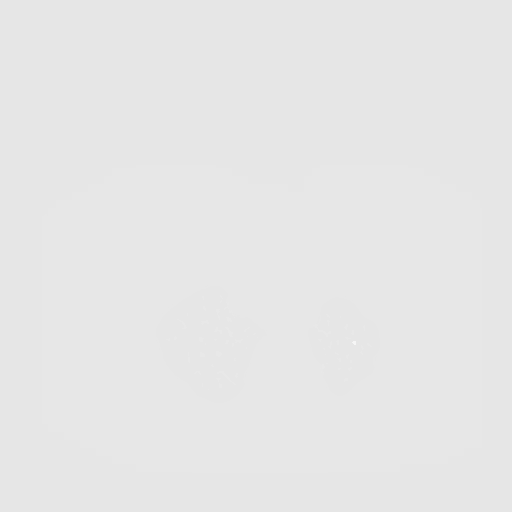

[9 of 40 positions shown; findings below may reference images not displayed]

FINDINGS: Cardiovascular: Normal heart size. Aortic atherosclerosis and
coronary artery calcifications.

Mediastinum/Nodes: No enlarged mediastinal, hilar, or axillary lymph
nodes. Thyroid gland, trachea, and esophagus demonstrate no
significant findings.

Lungs/Pleura: Mild centrilobular emphysema with diffuse bronchial
wall thickening. No pleural effusion, airspace consolidation, or
pneumothorax. Biapical pleuroparenchymal scarring noted. Left apical
calcified granuloma identified. A few small tiny lung nodules are
identified. The largest is in the periphery of the left lower lobe
with a mean derived diameter of 3.2 mm.

Upper Abdomen: No acute abnormality. Aortic
atherosclerosis.Exophytic structure arising off the posterior
stomach just above the left adrenal gland measures 1.8 by 1.8 cm,
image 49/2 and is favored to represent a gastric diverticulum.

Musculoskeletal: No chest wall mass or suspicious bone lesions
identified. Thoracic degenerative disc disease.
IMPRESSION: 1. Lung-RADS 2, benign appearance or behavior. Continue annual
screening with low-dose chest CT without contrast in 12 months.
2. Diffuse bronchial wall thickening with emphysema, as above;
imaging findings suggestive of underlying COPD.
3. Aortic Atherosclerosis ([RO]-[RO]) and Emphysema ([RO]-[RO]).

## 2021-11-21 ENCOUNTER — Other Ambulatory Visit: Payer: Self-pay | Admitting: Acute Care

## 2021-11-21 DIAGNOSIS — Z87891 Personal history of nicotine dependence: Secondary | ICD-10-CM

## 2021-11-21 DIAGNOSIS — F17210 Nicotine dependence, cigarettes, uncomplicated: Secondary | ICD-10-CM

## 2021-12-14 DIAGNOSIS — M5416 Radiculopathy, lumbar region: Secondary | ICD-10-CM | POA: Diagnosis not present

## 2021-12-14 DIAGNOSIS — M1712 Unilateral primary osteoarthritis, left knee: Secondary | ICD-10-CM | POA: Diagnosis not present

## 2021-12-25 DIAGNOSIS — M5416 Radiculopathy, lumbar region: Secondary | ICD-10-CM | POA: Diagnosis not present

## 2021-12-26 DIAGNOSIS — M1712 Unilateral primary osteoarthritis, left knee: Secondary | ICD-10-CM | POA: Diagnosis not present

## 2021-12-26 DIAGNOSIS — R293 Abnormal posture: Secondary | ICD-10-CM | POA: Diagnosis not present

## 2021-12-26 DIAGNOSIS — R2689 Other abnormalities of gait and mobility: Secondary | ICD-10-CM | POA: Diagnosis not present

## 2021-12-26 DIAGNOSIS — M6281 Muscle weakness (generalized): Secondary | ICD-10-CM | POA: Diagnosis not present

## 2021-12-26 DIAGNOSIS — R262 Difficulty in walking, not elsewhere classified: Secondary | ICD-10-CM | POA: Diagnosis not present

## 2021-12-26 DIAGNOSIS — M5416 Radiculopathy, lumbar region: Secondary | ICD-10-CM | POA: Diagnosis not present

## 2021-12-26 DIAGNOSIS — M25561 Pain in right knee: Secondary | ICD-10-CM | POA: Diagnosis not present

## 2021-12-26 DIAGNOSIS — M25562 Pain in left knee: Secondary | ICD-10-CM | POA: Diagnosis not present

## 2022-01-04 DIAGNOSIS — R2689 Other abnormalities of gait and mobility: Secondary | ICD-10-CM | POA: Diagnosis not present

## 2022-01-04 DIAGNOSIS — M25561 Pain in right knee: Secondary | ICD-10-CM | POA: Diagnosis not present

## 2022-01-04 DIAGNOSIS — R293 Abnormal posture: Secondary | ICD-10-CM | POA: Diagnosis not present

## 2022-01-04 DIAGNOSIS — M1712 Unilateral primary osteoarthritis, left knee: Secondary | ICD-10-CM | POA: Diagnosis not present

## 2022-01-04 DIAGNOSIS — M25562 Pain in left knee: Secondary | ICD-10-CM | POA: Diagnosis not present

## 2022-01-04 DIAGNOSIS — M6281 Muscle weakness (generalized): Secondary | ICD-10-CM | POA: Diagnosis not present

## 2022-01-04 DIAGNOSIS — R262 Difficulty in walking, not elsewhere classified: Secondary | ICD-10-CM | POA: Diagnosis not present

## 2022-01-04 DIAGNOSIS — M5416 Radiculopathy, lumbar region: Secondary | ICD-10-CM | POA: Diagnosis not present

## 2022-01-15 DIAGNOSIS — F419 Anxiety disorder, unspecified: Secondary | ICD-10-CM | POA: Diagnosis not present

## 2022-01-15 DIAGNOSIS — F17210 Nicotine dependence, cigarettes, uncomplicated: Secondary | ICD-10-CM | POA: Diagnosis not present

## 2022-01-15 DIAGNOSIS — I10 Essential (primary) hypertension: Secondary | ICD-10-CM | POA: Diagnosis not present

## 2022-01-15 DIAGNOSIS — M810 Age-related osteoporosis without current pathological fracture: Secondary | ICD-10-CM | POA: Diagnosis not present

## 2022-01-15 DIAGNOSIS — G629 Polyneuropathy, unspecified: Secondary | ICD-10-CM | POA: Diagnosis not present

## 2022-01-15 DIAGNOSIS — E039 Hypothyroidism, unspecified: Secondary | ICD-10-CM | POA: Diagnosis not present

## 2022-01-15 DIAGNOSIS — E7800 Pure hypercholesterolemia, unspecified: Secondary | ICD-10-CM | POA: Diagnosis not present

## 2022-01-25 DIAGNOSIS — M1712 Unilateral primary osteoarthritis, left knee: Secondary | ICD-10-CM | POA: Diagnosis not present

## 2022-01-25 DIAGNOSIS — M5416 Radiculopathy, lumbar region: Secondary | ICD-10-CM | POA: Diagnosis not present

## 2022-01-25 DIAGNOSIS — G259 Extrapyramidal and movement disorder, unspecified: Secondary | ICD-10-CM | POA: Diagnosis not present

## 2022-02-02 DIAGNOSIS — G6289 Other specified polyneuropathies: Secondary | ICD-10-CM | POA: Diagnosis not present

## 2022-02-02 DIAGNOSIS — G259 Extrapyramidal and movement disorder, unspecified: Secondary | ICD-10-CM | POA: Diagnosis not present

## 2022-03-22 DIAGNOSIS — R4189 Other symptoms and signs involving cognitive functions and awareness: Secondary | ICD-10-CM | POA: Diagnosis not present

## 2022-03-22 DIAGNOSIS — M549 Dorsalgia, unspecified: Secondary | ICD-10-CM | POA: Diagnosis not present

## 2022-03-22 DIAGNOSIS — R296 Repeated falls: Secondary | ICD-10-CM | POA: Diagnosis not present

## 2022-03-22 DIAGNOSIS — G9389 Other specified disorders of brain: Secondary | ICD-10-CM | POA: Diagnosis not present

## 2022-03-22 DIAGNOSIS — G259 Extrapyramidal and movement disorder, unspecified: Secondary | ICD-10-CM | POA: Diagnosis not present

## 2022-03-22 DIAGNOSIS — R2689 Other abnormalities of gait and mobility: Secondary | ICD-10-CM | POA: Diagnosis not present

## 2022-03-22 DIAGNOSIS — R32 Unspecified urinary incontinence: Secondary | ICD-10-CM | POA: Diagnosis not present

## 2022-03-22 DIAGNOSIS — R531 Weakness: Secondary | ICD-10-CM | POA: Diagnosis not present

## 2022-04-02 DIAGNOSIS — R051 Acute cough: Secondary | ICD-10-CM | POA: Diagnosis not present

## 2022-04-02 DIAGNOSIS — Z993 Dependence on wheelchair: Secondary | ICD-10-CM | POA: Diagnosis not present

## 2022-04-02 DIAGNOSIS — R296 Repeated falls: Secondary | ICD-10-CM | POA: Diagnosis not present

## 2022-04-02 DIAGNOSIS — F17210 Nicotine dependence, cigarettes, uncomplicated: Secondary | ICD-10-CM | POA: Diagnosis not present

## 2022-04-02 DIAGNOSIS — G629 Polyneuropathy, unspecified: Secondary | ICD-10-CM | POA: Diagnosis not present

## 2022-04-08 DIAGNOSIS — M8580 Other specified disorders of bone density and structure, unspecified site: Secondary | ICD-10-CM | POA: Diagnosis not present

## 2022-04-08 DIAGNOSIS — G6289 Other specified polyneuropathies: Secondary | ICD-10-CM | POA: Diagnosis not present

## 2022-04-08 DIAGNOSIS — E7800 Pure hypercholesterolemia, unspecified: Secondary | ICD-10-CM | POA: Diagnosis not present

## 2022-04-08 DIAGNOSIS — R7303 Prediabetes: Secondary | ICD-10-CM | POA: Diagnosis not present

## 2022-04-08 DIAGNOSIS — Z9181 History of falling: Secondary | ICD-10-CM | POA: Diagnosis not present

## 2022-04-08 DIAGNOSIS — E039 Hypothyroidism, unspecified: Secondary | ICD-10-CM | POA: Diagnosis not present

## 2022-04-08 DIAGNOSIS — M810 Age-related osteoporosis without current pathological fracture: Secondary | ICD-10-CM | POA: Diagnosis not present

## 2022-04-08 DIAGNOSIS — J439 Emphysema, unspecified: Secondary | ICD-10-CM | POA: Diagnosis not present

## 2022-04-08 DIAGNOSIS — G4700 Insomnia, unspecified: Secondary | ICD-10-CM | POA: Diagnosis not present

## 2022-04-08 DIAGNOSIS — F17210 Nicotine dependence, cigarettes, uncomplicated: Secondary | ICD-10-CM | POA: Diagnosis not present

## 2022-04-08 DIAGNOSIS — R296 Repeated falls: Secondary | ICD-10-CM | POA: Diagnosis not present

## 2022-04-08 DIAGNOSIS — I700 Atherosclerosis of aorta: Secondary | ICD-10-CM | POA: Diagnosis not present

## 2022-04-08 DIAGNOSIS — R0989 Other specified symptoms and signs involving the circulatory and respiratory systems: Secondary | ICD-10-CM | POA: Diagnosis not present

## 2022-04-08 DIAGNOSIS — I10 Essential (primary) hypertension: Secondary | ICD-10-CM | POA: Diagnosis not present

## 2022-04-08 DIAGNOSIS — Z993 Dependence on wheelchair: Secondary | ICD-10-CM | POA: Diagnosis not present

## 2022-04-08 DIAGNOSIS — F419 Anxiety disorder, unspecified: Secondary | ICD-10-CM | POA: Diagnosis not present

## 2022-04-19 DIAGNOSIS — J439 Emphysema, unspecified: Secondary | ICD-10-CM | POA: Diagnosis not present

## 2022-04-19 DIAGNOSIS — I700 Atherosclerosis of aorta: Secondary | ICD-10-CM | POA: Diagnosis not present

## 2022-04-19 DIAGNOSIS — E039 Hypothyroidism, unspecified: Secondary | ICD-10-CM | POA: Diagnosis not present

## 2022-04-19 DIAGNOSIS — R0989 Other specified symptoms and signs involving the circulatory and respiratory systems: Secondary | ICD-10-CM | POA: Diagnosis not present

## 2022-04-19 DIAGNOSIS — E7800 Pure hypercholesterolemia, unspecified: Secondary | ICD-10-CM | POA: Diagnosis not present

## 2022-04-19 DIAGNOSIS — M810 Age-related osteoporosis without current pathological fracture: Secondary | ICD-10-CM | POA: Diagnosis not present

## 2022-04-19 DIAGNOSIS — G4700 Insomnia, unspecified: Secondary | ICD-10-CM | POA: Diagnosis not present

## 2022-04-19 DIAGNOSIS — F17210 Nicotine dependence, cigarettes, uncomplicated: Secondary | ICD-10-CM | POA: Diagnosis not present

## 2022-04-19 DIAGNOSIS — F419 Anxiety disorder, unspecified: Secondary | ICD-10-CM | POA: Diagnosis not present

## 2022-04-19 DIAGNOSIS — R7303 Prediabetes: Secondary | ICD-10-CM | POA: Diagnosis not present

## 2022-04-19 DIAGNOSIS — Z9181 History of falling: Secondary | ICD-10-CM | POA: Diagnosis not present

## 2022-04-19 DIAGNOSIS — Z993 Dependence on wheelchair: Secondary | ICD-10-CM | POA: Diagnosis not present

## 2022-04-19 DIAGNOSIS — R296 Repeated falls: Secondary | ICD-10-CM | POA: Diagnosis not present

## 2022-04-19 DIAGNOSIS — M8580 Other specified disorders of bone density and structure, unspecified site: Secondary | ICD-10-CM | POA: Diagnosis not present

## 2022-04-19 DIAGNOSIS — G6289 Other specified polyneuropathies: Secondary | ICD-10-CM | POA: Diagnosis not present

## 2022-04-19 DIAGNOSIS — I10 Essential (primary) hypertension: Secondary | ICD-10-CM | POA: Diagnosis not present

## 2022-04-20 DIAGNOSIS — R0989 Other specified symptoms and signs involving the circulatory and respiratory systems: Secondary | ICD-10-CM | POA: Diagnosis not present

## 2022-04-20 DIAGNOSIS — F419 Anxiety disorder, unspecified: Secondary | ICD-10-CM | POA: Diagnosis not present

## 2022-04-20 DIAGNOSIS — G6289 Other specified polyneuropathies: Secondary | ICD-10-CM | POA: Diagnosis not present

## 2022-04-20 DIAGNOSIS — J439 Emphysema, unspecified: Secondary | ICD-10-CM | POA: Diagnosis not present

## 2022-04-20 DIAGNOSIS — R296 Repeated falls: Secondary | ICD-10-CM | POA: Diagnosis not present

## 2022-04-20 DIAGNOSIS — Z9181 History of falling: Secondary | ICD-10-CM | POA: Diagnosis not present

## 2022-04-20 DIAGNOSIS — M810 Age-related osteoporosis without current pathological fracture: Secondary | ICD-10-CM | POA: Diagnosis not present

## 2022-04-20 DIAGNOSIS — F17210 Nicotine dependence, cigarettes, uncomplicated: Secondary | ICD-10-CM | POA: Diagnosis not present

## 2022-04-20 DIAGNOSIS — G4700 Insomnia, unspecified: Secondary | ICD-10-CM | POA: Diagnosis not present

## 2022-04-20 DIAGNOSIS — E7800 Pure hypercholesterolemia, unspecified: Secondary | ICD-10-CM | POA: Diagnosis not present

## 2022-04-20 DIAGNOSIS — I10 Essential (primary) hypertension: Secondary | ICD-10-CM | POA: Diagnosis not present

## 2022-04-20 DIAGNOSIS — I700 Atherosclerosis of aorta: Secondary | ICD-10-CM | POA: Diagnosis not present

## 2022-04-20 DIAGNOSIS — R7303 Prediabetes: Secondary | ICD-10-CM | POA: Diagnosis not present

## 2022-04-20 DIAGNOSIS — Z993 Dependence on wheelchair: Secondary | ICD-10-CM | POA: Diagnosis not present

## 2022-04-20 DIAGNOSIS — M8580 Other specified disorders of bone density and structure, unspecified site: Secondary | ICD-10-CM | POA: Diagnosis not present

## 2022-04-20 DIAGNOSIS — E039 Hypothyroidism, unspecified: Secondary | ICD-10-CM | POA: Diagnosis not present

## 2022-04-23 DIAGNOSIS — F17210 Nicotine dependence, cigarettes, uncomplicated: Secondary | ICD-10-CM | POA: Diagnosis not present

## 2022-04-23 DIAGNOSIS — E039 Hypothyroidism, unspecified: Secondary | ICD-10-CM | POA: Diagnosis not present

## 2022-04-23 DIAGNOSIS — G6289 Other specified polyneuropathies: Secondary | ICD-10-CM | POA: Diagnosis not present

## 2022-04-23 DIAGNOSIS — J439 Emphysema, unspecified: Secondary | ICD-10-CM | POA: Diagnosis not present

## 2022-04-23 DIAGNOSIS — I700 Atherosclerosis of aorta: Secondary | ICD-10-CM | POA: Diagnosis not present

## 2022-04-23 DIAGNOSIS — F419 Anxiety disorder, unspecified: Secondary | ICD-10-CM | POA: Diagnosis not present

## 2022-04-23 DIAGNOSIS — E7800 Pure hypercholesterolemia, unspecified: Secondary | ICD-10-CM | POA: Diagnosis not present

## 2022-04-23 DIAGNOSIS — R296 Repeated falls: Secondary | ICD-10-CM | POA: Diagnosis not present

## 2022-04-23 DIAGNOSIS — R0989 Other specified symptoms and signs involving the circulatory and respiratory systems: Secondary | ICD-10-CM | POA: Diagnosis not present

## 2022-04-23 DIAGNOSIS — G4700 Insomnia, unspecified: Secondary | ICD-10-CM | POA: Diagnosis not present

## 2022-04-23 DIAGNOSIS — M810 Age-related osteoporosis without current pathological fracture: Secondary | ICD-10-CM | POA: Diagnosis not present

## 2022-04-23 DIAGNOSIS — R7303 Prediabetes: Secondary | ICD-10-CM | POA: Diagnosis not present

## 2022-04-23 DIAGNOSIS — Z993 Dependence on wheelchair: Secondary | ICD-10-CM | POA: Diagnosis not present

## 2022-04-23 DIAGNOSIS — Z9181 History of falling: Secondary | ICD-10-CM | POA: Diagnosis not present

## 2022-04-23 DIAGNOSIS — M8580 Other specified disorders of bone density and structure, unspecified site: Secondary | ICD-10-CM | POA: Diagnosis not present

## 2022-04-23 DIAGNOSIS — I10 Essential (primary) hypertension: Secondary | ICD-10-CM | POA: Diagnosis not present

## 2022-04-24 DIAGNOSIS — Z993 Dependence on wheelchair: Secondary | ICD-10-CM | POA: Diagnosis not present

## 2022-04-24 DIAGNOSIS — J439 Emphysema, unspecified: Secondary | ICD-10-CM | POA: Diagnosis not present

## 2022-04-24 DIAGNOSIS — F419 Anxiety disorder, unspecified: Secondary | ICD-10-CM | POA: Diagnosis not present

## 2022-04-24 DIAGNOSIS — M8580 Other specified disorders of bone density and structure, unspecified site: Secondary | ICD-10-CM | POA: Diagnosis not present

## 2022-04-24 DIAGNOSIS — E039 Hypothyroidism, unspecified: Secondary | ICD-10-CM | POA: Diagnosis not present

## 2022-04-24 DIAGNOSIS — I700 Atherosclerosis of aorta: Secondary | ICD-10-CM | POA: Diagnosis not present

## 2022-04-24 DIAGNOSIS — I10 Essential (primary) hypertension: Secondary | ICD-10-CM | POA: Diagnosis not present

## 2022-04-24 DIAGNOSIS — G4700 Insomnia, unspecified: Secondary | ICD-10-CM | POA: Diagnosis not present

## 2022-04-24 DIAGNOSIS — Z9181 History of falling: Secondary | ICD-10-CM | POA: Diagnosis not present

## 2022-04-24 DIAGNOSIS — R296 Repeated falls: Secondary | ICD-10-CM | POA: Diagnosis not present

## 2022-04-24 DIAGNOSIS — R0989 Other specified symptoms and signs involving the circulatory and respiratory systems: Secondary | ICD-10-CM | POA: Diagnosis not present

## 2022-04-24 DIAGNOSIS — R7303 Prediabetes: Secondary | ICD-10-CM | POA: Diagnosis not present

## 2022-04-24 DIAGNOSIS — E7800 Pure hypercholesterolemia, unspecified: Secondary | ICD-10-CM | POA: Diagnosis not present

## 2022-04-24 DIAGNOSIS — M810 Age-related osteoporosis without current pathological fracture: Secondary | ICD-10-CM | POA: Diagnosis not present

## 2022-04-24 DIAGNOSIS — G6289 Other specified polyneuropathies: Secondary | ICD-10-CM | POA: Diagnosis not present

## 2022-04-24 DIAGNOSIS — F17210 Nicotine dependence, cigarettes, uncomplicated: Secondary | ICD-10-CM | POA: Diagnosis not present

## 2022-04-25 DIAGNOSIS — R0989 Other specified symptoms and signs involving the circulatory and respiratory systems: Secondary | ICD-10-CM | POA: Diagnosis not present

## 2022-04-25 DIAGNOSIS — F419 Anxiety disorder, unspecified: Secondary | ICD-10-CM | POA: Diagnosis not present

## 2022-04-25 DIAGNOSIS — R296 Repeated falls: Secondary | ICD-10-CM | POA: Diagnosis not present

## 2022-04-25 DIAGNOSIS — I10 Essential (primary) hypertension: Secondary | ICD-10-CM | POA: Diagnosis not present

## 2022-04-25 DIAGNOSIS — G6289 Other specified polyneuropathies: Secondary | ICD-10-CM | POA: Diagnosis not present

## 2022-04-25 DIAGNOSIS — F17210 Nicotine dependence, cigarettes, uncomplicated: Secondary | ICD-10-CM | POA: Diagnosis not present

## 2022-04-25 DIAGNOSIS — J439 Emphysema, unspecified: Secondary | ICD-10-CM | POA: Diagnosis not present

## 2022-04-25 DIAGNOSIS — E039 Hypothyroidism, unspecified: Secondary | ICD-10-CM | POA: Diagnosis not present

## 2022-04-25 DIAGNOSIS — I700 Atherosclerosis of aorta: Secondary | ICD-10-CM | POA: Diagnosis not present

## 2022-04-25 DIAGNOSIS — Z9181 History of falling: Secondary | ICD-10-CM | POA: Diagnosis not present

## 2022-04-25 DIAGNOSIS — E7800 Pure hypercholesterolemia, unspecified: Secondary | ICD-10-CM | POA: Diagnosis not present

## 2022-04-25 DIAGNOSIS — R7303 Prediabetes: Secondary | ICD-10-CM | POA: Diagnosis not present

## 2022-04-25 DIAGNOSIS — Z993 Dependence on wheelchair: Secondary | ICD-10-CM | POA: Diagnosis not present

## 2022-04-25 DIAGNOSIS — G4700 Insomnia, unspecified: Secondary | ICD-10-CM | POA: Diagnosis not present

## 2022-04-25 DIAGNOSIS — M810 Age-related osteoporosis without current pathological fracture: Secondary | ICD-10-CM | POA: Diagnosis not present

## 2022-04-25 DIAGNOSIS — M8580 Other specified disorders of bone density and structure, unspecified site: Secondary | ICD-10-CM | POA: Diagnosis not present

## 2022-04-27 DIAGNOSIS — M8580 Other specified disorders of bone density and structure, unspecified site: Secondary | ICD-10-CM | POA: Diagnosis not present

## 2022-04-27 DIAGNOSIS — Z993 Dependence on wheelchair: Secondary | ICD-10-CM | POA: Diagnosis not present

## 2022-04-27 DIAGNOSIS — G6289 Other specified polyneuropathies: Secondary | ICD-10-CM | POA: Diagnosis not present

## 2022-04-27 DIAGNOSIS — R7303 Prediabetes: Secondary | ICD-10-CM | POA: Diagnosis not present

## 2022-04-27 DIAGNOSIS — R296 Repeated falls: Secondary | ICD-10-CM | POA: Diagnosis not present

## 2022-04-27 DIAGNOSIS — E7800 Pure hypercholesterolemia, unspecified: Secondary | ICD-10-CM | POA: Diagnosis not present

## 2022-04-27 DIAGNOSIS — G4700 Insomnia, unspecified: Secondary | ICD-10-CM | POA: Diagnosis not present

## 2022-04-27 DIAGNOSIS — Z9181 History of falling: Secondary | ICD-10-CM | POA: Diagnosis not present

## 2022-04-27 DIAGNOSIS — I10 Essential (primary) hypertension: Secondary | ICD-10-CM | POA: Diagnosis not present

## 2022-04-27 DIAGNOSIS — E039 Hypothyroidism, unspecified: Secondary | ICD-10-CM | POA: Diagnosis not present

## 2022-04-27 DIAGNOSIS — F419 Anxiety disorder, unspecified: Secondary | ICD-10-CM | POA: Diagnosis not present

## 2022-04-27 DIAGNOSIS — M810 Age-related osteoporosis without current pathological fracture: Secondary | ICD-10-CM | POA: Diagnosis not present

## 2022-04-27 DIAGNOSIS — J439 Emphysema, unspecified: Secondary | ICD-10-CM | POA: Diagnosis not present

## 2022-04-27 DIAGNOSIS — I700 Atherosclerosis of aorta: Secondary | ICD-10-CM | POA: Diagnosis not present

## 2022-04-27 DIAGNOSIS — R0989 Other specified symptoms and signs involving the circulatory and respiratory systems: Secondary | ICD-10-CM | POA: Diagnosis not present

## 2022-04-27 DIAGNOSIS — F17210 Nicotine dependence, cigarettes, uncomplicated: Secondary | ICD-10-CM | POA: Diagnosis not present

## 2022-05-01 DIAGNOSIS — Z993 Dependence on wheelchair: Secondary | ICD-10-CM | POA: Diagnosis not present

## 2022-05-01 DIAGNOSIS — G4700 Insomnia, unspecified: Secondary | ICD-10-CM | POA: Diagnosis not present

## 2022-05-01 DIAGNOSIS — Z9181 History of falling: Secondary | ICD-10-CM | POA: Diagnosis not present

## 2022-05-01 DIAGNOSIS — F419 Anxiety disorder, unspecified: Secondary | ICD-10-CM | POA: Diagnosis not present

## 2022-05-01 DIAGNOSIS — M810 Age-related osteoporosis without current pathological fracture: Secondary | ICD-10-CM | POA: Diagnosis not present

## 2022-05-01 DIAGNOSIS — I10 Essential (primary) hypertension: Secondary | ICD-10-CM | POA: Diagnosis not present

## 2022-05-01 DIAGNOSIS — G6289 Other specified polyneuropathies: Secondary | ICD-10-CM | POA: Diagnosis not present

## 2022-05-01 DIAGNOSIS — R7303 Prediabetes: Secondary | ICD-10-CM | POA: Diagnosis not present

## 2022-05-01 DIAGNOSIS — E039 Hypothyroidism, unspecified: Secondary | ICD-10-CM | POA: Diagnosis not present

## 2022-05-01 DIAGNOSIS — I700 Atherosclerosis of aorta: Secondary | ICD-10-CM | POA: Diagnosis not present

## 2022-05-01 DIAGNOSIS — R0989 Other specified symptoms and signs involving the circulatory and respiratory systems: Secondary | ICD-10-CM | POA: Diagnosis not present

## 2022-05-01 DIAGNOSIS — M8580 Other specified disorders of bone density and structure, unspecified site: Secondary | ICD-10-CM | POA: Diagnosis not present

## 2022-05-01 DIAGNOSIS — J439 Emphysema, unspecified: Secondary | ICD-10-CM | POA: Diagnosis not present

## 2022-05-01 DIAGNOSIS — R296 Repeated falls: Secondary | ICD-10-CM | POA: Diagnosis not present

## 2022-05-01 DIAGNOSIS — E7800 Pure hypercholesterolemia, unspecified: Secondary | ICD-10-CM | POA: Diagnosis not present

## 2022-05-01 DIAGNOSIS — F17210 Nicotine dependence, cigarettes, uncomplicated: Secondary | ICD-10-CM | POA: Diagnosis not present

## 2022-05-03 DIAGNOSIS — F419 Anxiety disorder, unspecified: Secondary | ICD-10-CM | POA: Diagnosis not present

## 2022-05-03 DIAGNOSIS — Z9181 History of falling: Secondary | ICD-10-CM | POA: Diagnosis not present

## 2022-05-03 DIAGNOSIS — E039 Hypothyroidism, unspecified: Secondary | ICD-10-CM | POA: Diagnosis not present

## 2022-05-03 DIAGNOSIS — M8580 Other specified disorders of bone density and structure, unspecified site: Secondary | ICD-10-CM | POA: Diagnosis not present

## 2022-05-03 DIAGNOSIS — M810 Age-related osteoporosis without current pathological fracture: Secondary | ICD-10-CM | POA: Diagnosis not present

## 2022-05-03 DIAGNOSIS — F17210 Nicotine dependence, cigarettes, uncomplicated: Secondary | ICD-10-CM | POA: Diagnosis not present

## 2022-05-03 DIAGNOSIS — Z993 Dependence on wheelchair: Secondary | ICD-10-CM | POA: Diagnosis not present

## 2022-05-03 DIAGNOSIS — E7800 Pure hypercholesterolemia, unspecified: Secondary | ICD-10-CM | POA: Diagnosis not present

## 2022-05-03 DIAGNOSIS — I10 Essential (primary) hypertension: Secondary | ICD-10-CM | POA: Diagnosis not present

## 2022-05-03 DIAGNOSIS — R7303 Prediabetes: Secondary | ICD-10-CM | POA: Diagnosis not present

## 2022-05-03 DIAGNOSIS — J439 Emphysema, unspecified: Secondary | ICD-10-CM | POA: Diagnosis not present

## 2022-05-03 DIAGNOSIS — R0989 Other specified symptoms and signs involving the circulatory and respiratory systems: Secondary | ICD-10-CM | POA: Diagnosis not present

## 2022-05-03 DIAGNOSIS — G6289 Other specified polyneuropathies: Secondary | ICD-10-CM | POA: Diagnosis not present

## 2022-05-03 DIAGNOSIS — I700 Atherosclerosis of aorta: Secondary | ICD-10-CM | POA: Diagnosis not present

## 2022-05-03 DIAGNOSIS — R296 Repeated falls: Secondary | ICD-10-CM | POA: Diagnosis not present

## 2022-05-03 DIAGNOSIS — G4700 Insomnia, unspecified: Secondary | ICD-10-CM | POA: Diagnosis not present

## 2022-05-04 ENCOUNTER — Ambulatory Visit: Payer: Self-pay | Admitting: *Deleted

## 2022-05-07 DIAGNOSIS — J439 Emphysema, unspecified: Secondary | ICD-10-CM | POA: Diagnosis not present

## 2022-05-07 DIAGNOSIS — R0989 Other specified symptoms and signs involving the circulatory and respiratory systems: Secondary | ICD-10-CM | POA: Diagnosis not present

## 2022-05-07 DIAGNOSIS — E7800 Pure hypercholesterolemia, unspecified: Secondary | ICD-10-CM | POA: Diagnosis not present

## 2022-05-07 DIAGNOSIS — I10 Essential (primary) hypertension: Secondary | ICD-10-CM | POA: Diagnosis not present

## 2022-05-07 DIAGNOSIS — Z9181 History of falling: Secondary | ICD-10-CM | POA: Diagnosis not present

## 2022-05-07 DIAGNOSIS — E039 Hypothyroidism, unspecified: Secondary | ICD-10-CM | POA: Diagnosis not present

## 2022-05-07 DIAGNOSIS — R296 Repeated falls: Secondary | ICD-10-CM | POA: Diagnosis not present

## 2022-05-07 DIAGNOSIS — R7303 Prediabetes: Secondary | ICD-10-CM | POA: Diagnosis not present

## 2022-05-07 DIAGNOSIS — Z993 Dependence on wheelchair: Secondary | ICD-10-CM | POA: Diagnosis not present

## 2022-05-07 DIAGNOSIS — G4700 Insomnia, unspecified: Secondary | ICD-10-CM | POA: Diagnosis not present

## 2022-05-07 DIAGNOSIS — F17210 Nicotine dependence, cigarettes, uncomplicated: Secondary | ICD-10-CM | POA: Diagnosis not present

## 2022-05-07 DIAGNOSIS — G6289 Other specified polyneuropathies: Secondary | ICD-10-CM | POA: Diagnosis not present

## 2022-05-07 DIAGNOSIS — I700 Atherosclerosis of aorta: Secondary | ICD-10-CM | POA: Diagnosis not present

## 2022-05-07 DIAGNOSIS — F419 Anxiety disorder, unspecified: Secondary | ICD-10-CM | POA: Diagnosis not present

## 2022-05-07 DIAGNOSIS — M810 Age-related osteoporosis without current pathological fracture: Secondary | ICD-10-CM | POA: Diagnosis not present

## 2022-05-07 DIAGNOSIS — M8580 Other specified disorders of bone density and structure, unspecified site: Secondary | ICD-10-CM | POA: Diagnosis not present

## 2022-05-09 DIAGNOSIS — G4700 Insomnia, unspecified: Secondary | ICD-10-CM | POA: Diagnosis not present

## 2022-05-09 DIAGNOSIS — I700 Atherosclerosis of aorta: Secondary | ICD-10-CM | POA: Diagnosis not present

## 2022-05-09 DIAGNOSIS — M8580 Other specified disorders of bone density and structure, unspecified site: Secondary | ICD-10-CM | POA: Diagnosis not present

## 2022-05-09 DIAGNOSIS — E039 Hypothyroidism, unspecified: Secondary | ICD-10-CM | POA: Diagnosis not present

## 2022-05-09 DIAGNOSIS — G6289 Other specified polyneuropathies: Secondary | ICD-10-CM | POA: Diagnosis not present

## 2022-05-09 DIAGNOSIS — I10 Essential (primary) hypertension: Secondary | ICD-10-CM | POA: Diagnosis not present

## 2022-05-09 DIAGNOSIS — M810 Age-related osteoporosis without current pathological fracture: Secondary | ICD-10-CM | POA: Diagnosis not present

## 2022-05-09 DIAGNOSIS — Z993 Dependence on wheelchair: Secondary | ICD-10-CM | POA: Diagnosis not present

## 2022-05-09 DIAGNOSIS — R7303 Prediabetes: Secondary | ICD-10-CM | POA: Diagnosis not present

## 2022-05-09 DIAGNOSIS — Z9181 History of falling: Secondary | ICD-10-CM | POA: Diagnosis not present

## 2022-05-09 DIAGNOSIS — J439 Emphysema, unspecified: Secondary | ICD-10-CM | POA: Diagnosis not present

## 2022-05-09 DIAGNOSIS — F17210 Nicotine dependence, cigarettes, uncomplicated: Secondary | ICD-10-CM | POA: Diagnosis not present

## 2022-05-09 DIAGNOSIS — R296 Repeated falls: Secondary | ICD-10-CM | POA: Diagnosis not present

## 2022-05-09 DIAGNOSIS — R0989 Other specified symptoms and signs involving the circulatory and respiratory systems: Secondary | ICD-10-CM | POA: Diagnosis not present

## 2022-05-09 DIAGNOSIS — F419 Anxiety disorder, unspecified: Secondary | ICD-10-CM | POA: Diagnosis not present

## 2022-05-09 DIAGNOSIS — E7800 Pure hypercholesterolemia, unspecified: Secondary | ICD-10-CM | POA: Diagnosis not present

## 2022-05-10 DIAGNOSIS — G9389 Other specified disorders of brain: Secondary | ICD-10-CM | POA: Diagnosis not present

## 2022-05-10 DIAGNOSIS — R2689 Other abnormalities of gait and mobility: Secondary | ICD-10-CM | POA: Diagnosis not present

## 2022-05-11 DIAGNOSIS — R296 Repeated falls: Secondary | ICD-10-CM | POA: Diagnosis not present

## 2022-05-11 DIAGNOSIS — M8580 Other specified disorders of bone density and structure, unspecified site: Secondary | ICD-10-CM | POA: Diagnosis not present

## 2022-05-11 DIAGNOSIS — R0989 Other specified symptoms and signs involving the circulatory and respiratory systems: Secondary | ICD-10-CM | POA: Diagnosis not present

## 2022-05-11 DIAGNOSIS — E039 Hypothyroidism, unspecified: Secondary | ICD-10-CM | POA: Diagnosis not present

## 2022-05-11 DIAGNOSIS — F17210 Nicotine dependence, cigarettes, uncomplicated: Secondary | ICD-10-CM | POA: Diagnosis not present

## 2022-05-11 DIAGNOSIS — I10 Essential (primary) hypertension: Secondary | ICD-10-CM | POA: Diagnosis not present

## 2022-05-11 DIAGNOSIS — Z9181 History of falling: Secondary | ICD-10-CM | POA: Diagnosis not present

## 2022-05-11 DIAGNOSIS — E7800 Pure hypercholesterolemia, unspecified: Secondary | ICD-10-CM | POA: Diagnosis not present

## 2022-05-11 DIAGNOSIS — R7303 Prediabetes: Secondary | ICD-10-CM | POA: Diagnosis not present

## 2022-05-11 DIAGNOSIS — G6289 Other specified polyneuropathies: Secondary | ICD-10-CM | POA: Diagnosis not present

## 2022-05-11 DIAGNOSIS — I700 Atherosclerosis of aorta: Secondary | ICD-10-CM | POA: Diagnosis not present

## 2022-05-11 DIAGNOSIS — G4700 Insomnia, unspecified: Secondary | ICD-10-CM | POA: Diagnosis not present

## 2022-05-11 DIAGNOSIS — Z993 Dependence on wheelchair: Secondary | ICD-10-CM | POA: Diagnosis not present

## 2022-05-11 DIAGNOSIS — J439 Emphysema, unspecified: Secondary | ICD-10-CM | POA: Diagnosis not present

## 2022-05-11 DIAGNOSIS — M810 Age-related osteoporosis without current pathological fracture: Secondary | ICD-10-CM | POA: Diagnosis not present

## 2022-05-11 DIAGNOSIS — F419 Anxiety disorder, unspecified: Secondary | ICD-10-CM | POA: Diagnosis not present

## 2022-05-15 DIAGNOSIS — Z9181 History of falling: Secondary | ICD-10-CM | POA: Diagnosis not present

## 2022-05-15 DIAGNOSIS — M810 Age-related osteoporosis without current pathological fracture: Secondary | ICD-10-CM | POA: Diagnosis not present

## 2022-05-15 DIAGNOSIS — G6289 Other specified polyneuropathies: Secondary | ICD-10-CM | POA: Diagnosis not present

## 2022-05-15 DIAGNOSIS — R296 Repeated falls: Secondary | ICD-10-CM | POA: Diagnosis not present

## 2022-05-15 DIAGNOSIS — R0989 Other specified symptoms and signs involving the circulatory and respiratory systems: Secondary | ICD-10-CM | POA: Diagnosis not present

## 2022-05-15 DIAGNOSIS — Z993 Dependence on wheelchair: Secondary | ICD-10-CM | POA: Diagnosis not present

## 2022-05-15 DIAGNOSIS — R7303 Prediabetes: Secondary | ICD-10-CM | POA: Diagnosis not present

## 2022-05-15 DIAGNOSIS — E7800 Pure hypercholesterolemia, unspecified: Secondary | ICD-10-CM | POA: Diagnosis not present

## 2022-05-15 DIAGNOSIS — I700 Atherosclerosis of aorta: Secondary | ICD-10-CM | POA: Diagnosis not present

## 2022-05-15 DIAGNOSIS — G4700 Insomnia, unspecified: Secondary | ICD-10-CM | POA: Diagnosis not present

## 2022-05-15 DIAGNOSIS — J439 Emphysema, unspecified: Secondary | ICD-10-CM | POA: Diagnosis not present

## 2022-05-15 DIAGNOSIS — F17210 Nicotine dependence, cigarettes, uncomplicated: Secondary | ICD-10-CM | POA: Diagnosis not present

## 2022-05-15 DIAGNOSIS — F419 Anxiety disorder, unspecified: Secondary | ICD-10-CM | POA: Diagnosis not present

## 2022-05-15 DIAGNOSIS — E039 Hypothyroidism, unspecified: Secondary | ICD-10-CM | POA: Diagnosis not present

## 2022-05-15 DIAGNOSIS — I10 Essential (primary) hypertension: Secondary | ICD-10-CM | POA: Diagnosis not present

## 2022-05-15 DIAGNOSIS — M8580 Other specified disorders of bone density and structure, unspecified site: Secondary | ICD-10-CM | POA: Diagnosis not present

## 2022-05-16 DIAGNOSIS — R0989 Other specified symptoms and signs involving the circulatory and respiratory systems: Secondary | ICD-10-CM | POA: Diagnosis not present

## 2022-05-16 DIAGNOSIS — E039 Hypothyroidism, unspecified: Secondary | ICD-10-CM | POA: Diagnosis not present

## 2022-05-16 DIAGNOSIS — I700 Atherosclerosis of aorta: Secondary | ICD-10-CM | POA: Diagnosis not present

## 2022-05-16 DIAGNOSIS — G6289 Other specified polyneuropathies: Secondary | ICD-10-CM | POA: Diagnosis not present

## 2022-05-16 DIAGNOSIS — I10 Essential (primary) hypertension: Secondary | ICD-10-CM | POA: Diagnosis not present

## 2022-05-16 DIAGNOSIS — R296 Repeated falls: Secondary | ICD-10-CM | POA: Diagnosis not present

## 2022-05-16 DIAGNOSIS — M810 Age-related osteoporosis without current pathological fracture: Secondary | ICD-10-CM | POA: Diagnosis not present

## 2022-05-16 DIAGNOSIS — J439 Emphysema, unspecified: Secondary | ICD-10-CM | POA: Diagnosis not present

## 2022-05-16 DIAGNOSIS — Z993 Dependence on wheelchair: Secondary | ICD-10-CM | POA: Diagnosis not present

## 2022-05-16 DIAGNOSIS — M8580 Other specified disorders of bone density and structure, unspecified site: Secondary | ICD-10-CM | POA: Diagnosis not present

## 2022-05-16 DIAGNOSIS — E7800 Pure hypercholesterolemia, unspecified: Secondary | ICD-10-CM | POA: Diagnosis not present

## 2022-05-16 DIAGNOSIS — F419 Anxiety disorder, unspecified: Secondary | ICD-10-CM | POA: Diagnosis not present

## 2022-05-16 DIAGNOSIS — F17210 Nicotine dependence, cigarettes, uncomplicated: Secondary | ICD-10-CM | POA: Diagnosis not present

## 2022-05-16 DIAGNOSIS — Z9181 History of falling: Secondary | ICD-10-CM | POA: Diagnosis not present

## 2022-05-16 DIAGNOSIS — R7303 Prediabetes: Secondary | ICD-10-CM | POA: Diagnosis not present

## 2022-05-16 DIAGNOSIS — G4700 Insomnia, unspecified: Secondary | ICD-10-CM | POA: Diagnosis not present

## 2022-05-22 DIAGNOSIS — M810 Age-related osteoporosis without current pathological fracture: Secondary | ICD-10-CM | POA: Diagnosis not present

## 2022-05-22 DIAGNOSIS — G6289 Other specified polyneuropathies: Secondary | ICD-10-CM | POA: Diagnosis not present

## 2022-05-22 DIAGNOSIS — R0989 Other specified symptoms and signs involving the circulatory and respiratory systems: Secondary | ICD-10-CM | POA: Diagnosis not present

## 2022-05-22 DIAGNOSIS — Z993 Dependence on wheelchair: Secondary | ICD-10-CM | POA: Diagnosis not present

## 2022-05-22 DIAGNOSIS — Z9181 History of falling: Secondary | ICD-10-CM | POA: Diagnosis not present

## 2022-05-22 DIAGNOSIS — E039 Hypothyroidism, unspecified: Secondary | ICD-10-CM | POA: Diagnosis not present

## 2022-05-22 DIAGNOSIS — G4700 Insomnia, unspecified: Secondary | ICD-10-CM | POA: Diagnosis not present

## 2022-05-22 DIAGNOSIS — M8580 Other specified disorders of bone density and structure, unspecified site: Secondary | ICD-10-CM | POA: Diagnosis not present

## 2022-05-22 DIAGNOSIS — R7303 Prediabetes: Secondary | ICD-10-CM | POA: Diagnosis not present

## 2022-05-22 DIAGNOSIS — F17210 Nicotine dependence, cigarettes, uncomplicated: Secondary | ICD-10-CM | POA: Diagnosis not present

## 2022-05-22 DIAGNOSIS — F419 Anxiety disorder, unspecified: Secondary | ICD-10-CM | POA: Diagnosis not present

## 2022-05-22 DIAGNOSIS — J439 Emphysema, unspecified: Secondary | ICD-10-CM | POA: Diagnosis not present

## 2022-05-22 DIAGNOSIS — I10 Essential (primary) hypertension: Secondary | ICD-10-CM | POA: Diagnosis not present

## 2022-05-22 DIAGNOSIS — R296 Repeated falls: Secondary | ICD-10-CM | POA: Diagnosis not present

## 2022-05-22 DIAGNOSIS — I700 Atherosclerosis of aorta: Secondary | ICD-10-CM | POA: Diagnosis not present

## 2022-05-22 DIAGNOSIS — E7800 Pure hypercholesterolemia, unspecified: Secondary | ICD-10-CM | POA: Diagnosis not present

## 2022-05-24 DIAGNOSIS — G4700 Insomnia, unspecified: Secondary | ICD-10-CM | POA: Diagnosis not present

## 2022-05-24 DIAGNOSIS — M8580 Other specified disorders of bone density and structure, unspecified site: Secondary | ICD-10-CM | POA: Diagnosis not present

## 2022-05-24 DIAGNOSIS — I10 Essential (primary) hypertension: Secondary | ICD-10-CM | POA: Diagnosis not present

## 2022-05-24 DIAGNOSIS — I700 Atherosclerosis of aorta: Secondary | ICD-10-CM | POA: Diagnosis not present

## 2022-05-24 DIAGNOSIS — F17210 Nicotine dependence, cigarettes, uncomplicated: Secondary | ICD-10-CM | POA: Diagnosis not present

## 2022-05-24 DIAGNOSIS — M810 Age-related osteoporosis without current pathological fracture: Secondary | ICD-10-CM | POA: Diagnosis not present

## 2022-05-24 DIAGNOSIS — G6289 Other specified polyneuropathies: Secondary | ICD-10-CM | POA: Diagnosis not present

## 2022-05-24 DIAGNOSIS — R7303 Prediabetes: Secondary | ICD-10-CM | POA: Diagnosis not present

## 2022-05-24 DIAGNOSIS — R0989 Other specified symptoms and signs involving the circulatory and respiratory systems: Secondary | ICD-10-CM | POA: Diagnosis not present

## 2022-05-24 DIAGNOSIS — Z9181 History of falling: Secondary | ICD-10-CM | POA: Diagnosis not present

## 2022-05-24 DIAGNOSIS — J439 Emphysema, unspecified: Secondary | ICD-10-CM | POA: Diagnosis not present

## 2022-05-24 DIAGNOSIS — Z993 Dependence on wheelchair: Secondary | ICD-10-CM | POA: Diagnosis not present

## 2022-05-24 DIAGNOSIS — R296 Repeated falls: Secondary | ICD-10-CM | POA: Diagnosis not present

## 2022-05-24 DIAGNOSIS — E7800 Pure hypercholesterolemia, unspecified: Secondary | ICD-10-CM | POA: Diagnosis not present

## 2022-05-24 DIAGNOSIS — F419 Anxiety disorder, unspecified: Secondary | ICD-10-CM | POA: Diagnosis not present

## 2022-05-24 DIAGNOSIS — E039 Hypothyroidism, unspecified: Secondary | ICD-10-CM | POA: Diagnosis not present

## 2022-05-28 DIAGNOSIS — R0989 Other specified symptoms and signs involving the circulatory and respiratory systems: Secondary | ICD-10-CM | POA: Diagnosis not present

## 2022-05-28 DIAGNOSIS — M810 Age-related osteoporosis without current pathological fracture: Secondary | ICD-10-CM | POA: Diagnosis not present

## 2022-05-28 DIAGNOSIS — R296 Repeated falls: Secondary | ICD-10-CM | POA: Diagnosis not present

## 2022-05-28 DIAGNOSIS — I10 Essential (primary) hypertension: Secondary | ICD-10-CM | POA: Diagnosis not present

## 2022-05-28 DIAGNOSIS — E039 Hypothyroidism, unspecified: Secondary | ICD-10-CM | POA: Diagnosis not present

## 2022-05-28 DIAGNOSIS — F419 Anxiety disorder, unspecified: Secondary | ICD-10-CM | POA: Diagnosis not present

## 2022-05-28 DIAGNOSIS — M8580 Other specified disorders of bone density and structure, unspecified site: Secondary | ICD-10-CM | POA: Diagnosis not present

## 2022-05-28 DIAGNOSIS — I700 Atherosclerosis of aorta: Secondary | ICD-10-CM | POA: Diagnosis not present

## 2022-05-28 DIAGNOSIS — R7303 Prediabetes: Secondary | ICD-10-CM | POA: Diagnosis not present

## 2022-05-28 DIAGNOSIS — G6289 Other specified polyneuropathies: Secondary | ICD-10-CM | POA: Diagnosis not present

## 2022-05-28 DIAGNOSIS — J439 Emphysema, unspecified: Secondary | ICD-10-CM | POA: Diagnosis not present

## 2022-05-28 DIAGNOSIS — G4700 Insomnia, unspecified: Secondary | ICD-10-CM | POA: Diagnosis not present

## 2022-05-28 DIAGNOSIS — Z993 Dependence on wheelchair: Secondary | ICD-10-CM | POA: Diagnosis not present

## 2022-05-28 DIAGNOSIS — F17210 Nicotine dependence, cigarettes, uncomplicated: Secondary | ICD-10-CM | POA: Diagnosis not present

## 2022-05-28 DIAGNOSIS — Z9181 History of falling: Secondary | ICD-10-CM | POA: Diagnosis not present

## 2022-05-28 DIAGNOSIS — E7800 Pure hypercholesterolemia, unspecified: Secondary | ICD-10-CM | POA: Diagnosis not present

## 2022-05-29 DIAGNOSIS — G912 (Idiopathic) normal pressure hydrocephalus: Secondary | ICD-10-CM | POA: Diagnosis not present

## 2022-05-29 DIAGNOSIS — R29898 Other symptoms and signs involving the musculoskeletal system: Secondary | ICD-10-CM | POA: Diagnosis not present

## 2022-05-29 DIAGNOSIS — G9389 Other specified disorders of brain: Secondary | ICD-10-CM | POA: Diagnosis not present

## 2022-05-31 DIAGNOSIS — M8580 Other specified disorders of bone density and structure, unspecified site: Secondary | ICD-10-CM | POA: Diagnosis not present

## 2022-05-31 DIAGNOSIS — F419 Anxiety disorder, unspecified: Secondary | ICD-10-CM | POA: Diagnosis not present

## 2022-05-31 DIAGNOSIS — I10 Essential (primary) hypertension: Secondary | ICD-10-CM | POA: Diagnosis not present

## 2022-05-31 DIAGNOSIS — E7800 Pure hypercholesterolemia, unspecified: Secondary | ICD-10-CM | POA: Diagnosis not present

## 2022-05-31 DIAGNOSIS — J439 Emphysema, unspecified: Secondary | ICD-10-CM | POA: Diagnosis not present

## 2022-05-31 DIAGNOSIS — I700 Atherosclerosis of aorta: Secondary | ICD-10-CM | POA: Diagnosis not present

## 2022-05-31 DIAGNOSIS — R7303 Prediabetes: Secondary | ICD-10-CM | POA: Diagnosis not present

## 2022-05-31 DIAGNOSIS — Z9181 History of falling: Secondary | ICD-10-CM | POA: Diagnosis not present

## 2022-05-31 DIAGNOSIS — F17210 Nicotine dependence, cigarettes, uncomplicated: Secondary | ICD-10-CM | POA: Diagnosis not present

## 2022-05-31 DIAGNOSIS — R0989 Other specified symptoms and signs involving the circulatory and respiratory systems: Secondary | ICD-10-CM | POA: Diagnosis not present

## 2022-05-31 DIAGNOSIS — G4700 Insomnia, unspecified: Secondary | ICD-10-CM | POA: Diagnosis not present

## 2022-05-31 DIAGNOSIS — Z993 Dependence on wheelchair: Secondary | ICD-10-CM | POA: Diagnosis not present

## 2022-05-31 DIAGNOSIS — E039 Hypothyroidism, unspecified: Secondary | ICD-10-CM | POA: Diagnosis not present

## 2022-05-31 DIAGNOSIS — R296 Repeated falls: Secondary | ICD-10-CM | POA: Diagnosis not present

## 2022-05-31 DIAGNOSIS — M810 Age-related osteoporosis without current pathological fracture: Secondary | ICD-10-CM | POA: Diagnosis not present

## 2022-05-31 DIAGNOSIS — G6289 Other specified polyneuropathies: Secondary | ICD-10-CM | POA: Diagnosis not present

## 2022-06-04 DIAGNOSIS — Z9181 History of falling: Secondary | ICD-10-CM | POA: Diagnosis not present

## 2022-06-04 DIAGNOSIS — I10 Essential (primary) hypertension: Secondary | ICD-10-CM | POA: Diagnosis not present

## 2022-06-04 DIAGNOSIS — J439 Emphysema, unspecified: Secondary | ICD-10-CM | POA: Diagnosis not present

## 2022-06-04 DIAGNOSIS — E7800 Pure hypercholesterolemia, unspecified: Secondary | ICD-10-CM | POA: Diagnosis not present

## 2022-06-04 DIAGNOSIS — R0989 Other specified symptoms and signs involving the circulatory and respiratory systems: Secondary | ICD-10-CM | POA: Diagnosis not present

## 2022-06-04 DIAGNOSIS — M8580 Other specified disorders of bone density and structure, unspecified site: Secondary | ICD-10-CM | POA: Diagnosis not present

## 2022-06-04 DIAGNOSIS — G4700 Insomnia, unspecified: Secondary | ICD-10-CM | POA: Diagnosis not present

## 2022-06-04 DIAGNOSIS — I700 Atherosclerosis of aorta: Secondary | ICD-10-CM | POA: Diagnosis not present

## 2022-06-04 DIAGNOSIS — Z993 Dependence on wheelchair: Secondary | ICD-10-CM | POA: Diagnosis not present

## 2022-06-04 DIAGNOSIS — M810 Age-related osteoporosis without current pathological fracture: Secondary | ICD-10-CM | POA: Diagnosis not present

## 2022-06-04 DIAGNOSIS — F419 Anxiety disorder, unspecified: Secondary | ICD-10-CM | POA: Diagnosis not present

## 2022-06-04 DIAGNOSIS — F17210 Nicotine dependence, cigarettes, uncomplicated: Secondary | ICD-10-CM | POA: Diagnosis not present

## 2022-06-04 DIAGNOSIS — E039 Hypothyroidism, unspecified: Secondary | ICD-10-CM | POA: Diagnosis not present

## 2022-06-04 DIAGNOSIS — G6289 Other specified polyneuropathies: Secondary | ICD-10-CM | POA: Diagnosis not present

## 2022-06-04 DIAGNOSIS — R296 Repeated falls: Secondary | ICD-10-CM | POA: Diagnosis not present

## 2022-06-04 DIAGNOSIS — R7303 Prediabetes: Secondary | ICD-10-CM | POA: Diagnosis not present

## 2022-06-29 DIAGNOSIS — R7303 Prediabetes: Secondary | ICD-10-CM | POA: Diagnosis not present

## 2022-07-04 DIAGNOSIS — J439 Emphysema, unspecified: Secondary | ICD-10-CM | POA: Diagnosis not present

## 2022-07-04 DIAGNOSIS — F411 Generalized anxiety disorder: Secondary | ICD-10-CM | POA: Diagnosis not present

## 2022-07-04 DIAGNOSIS — Z720 Tobacco use: Secondary | ICD-10-CM | POA: Diagnosis not present

## 2022-07-04 DIAGNOSIS — G4700 Insomnia, unspecified: Secondary | ICD-10-CM | POA: Diagnosis not present

## 2022-07-04 DIAGNOSIS — Z79890 Hormone replacement therapy (postmenopausal): Secondary | ICD-10-CM | POA: Diagnosis not present

## 2022-07-04 DIAGNOSIS — M810 Age-related osteoporosis without current pathological fracture: Secondary | ICD-10-CM | POA: Diagnosis not present

## 2022-07-04 DIAGNOSIS — G912 (Idiopathic) normal pressure hydrocephalus: Secondary | ICD-10-CM | POA: Diagnosis not present

## 2022-07-04 DIAGNOSIS — Z982 Presence of cerebrospinal fluid drainage device: Secondary | ICD-10-CM | POA: Diagnosis not present

## 2022-07-04 DIAGNOSIS — I10 Essential (primary) hypertension: Secondary | ICD-10-CM | POA: Diagnosis not present

## 2022-07-04 DIAGNOSIS — I700 Atherosclerosis of aorta: Secondary | ICD-10-CM | POA: Diagnosis not present

## 2022-07-04 DIAGNOSIS — G629 Polyneuropathy, unspecified: Secondary | ICD-10-CM | POA: Diagnosis not present

## 2022-07-04 DIAGNOSIS — Z7983 Long term (current) use of bisphosphonates: Secondary | ICD-10-CM | POA: Diagnosis not present

## 2022-07-04 DIAGNOSIS — R7303 Prediabetes: Secondary | ICD-10-CM | POA: Diagnosis not present

## 2022-07-04 DIAGNOSIS — E7800 Pure hypercholesterolemia, unspecified: Secondary | ICD-10-CM | POA: Diagnosis not present

## 2022-07-13 DIAGNOSIS — Z982 Presence of cerebrospinal fluid drainage device: Secondary | ICD-10-CM | POA: Diagnosis not present

## 2022-07-13 DIAGNOSIS — Z4801 Encounter for change or removal of surgical wound dressing: Secondary | ICD-10-CM | POA: Diagnosis not present

## 2022-07-13 DIAGNOSIS — Z4541 Encounter for adjustment and management of cerebrospinal fluid drainage device: Secondary | ICD-10-CM | POA: Diagnosis not present

## 2022-07-13 DIAGNOSIS — G912 (Idiopathic) normal pressure hydrocephalus: Secondary | ICD-10-CM | POA: Diagnosis not present

## 2022-07-18 DIAGNOSIS — F17210 Nicotine dependence, cigarettes, uncomplicated: Secondary | ICD-10-CM | POA: Diagnosis not present

## 2022-07-18 DIAGNOSIS — E7800 Pure hypercholesterolemia, unspecified: Secondary | ICD-10-CM | POA: Diagnosis not present

## 2022-07-18 DIAGNOSIS — G629 Polyneuropathy, unspecified: Secondary | ICD-10-CM | POA: Diagnosis not present

## 2022-07-18 DIAGNOSIS — I10 Essential (primary) hypertension: Secondary | ICD-10-CM | POA: Diagnosis not present

## 2022-07-18 DIAGNOSIS — Z993 Dependence on wheelchair: Secondary | ICD-10-CM | POA: Diagnosis not present

## 2022-07-18 DIAGNOSIS — E039 Hypothyroidism, unspecified: Secondary | ICD-10-CM | POA: Diagnosis not present

## 2022-07-18 DIAGNOSIS — F419 Anxiety disorder, unspecified: Secondary | ICD-10-CM | POA: Diagnosis not present

## 2022-07-18 DIAGNOSIS — M810 Age-related osteoporosis without current pathological fracture: Secondary | ICD-10-CM | POA: Diagnosis not present

## 2022-07-18 DIAGNOSIS — R29898 Other symptoms and signs involving the musculoskeletal system: Secondary | ICD-10-CM | POA: Diagnosis not present

## 2022-07-18 DIAGNOSIS — R7303 Prediabetes: Secondary | ICD-10-CM | POA: Diagnosis not present

## 2022-07-19 DIAGNOSIS — E039 Hypothyroidism, unspecified: Secondary | ICD-10-CM | POA: Diagnosis not present

## 2022-07-19 DIAGNOSIS — F419 Anxiety disorder, unspecified: Secondary | ICD-10-CM | POA: Diagnosis not present

## 2022-07-19 DIAGNOSIS — Z993 Dependence on wheelchair: Secondary | ICD-10-CM | POA: Diagnosis not present

## 2022-07-19 DIAGNOSIS — G629 Polyneuropathy, unspecified: Secondary | ICD-10-CM | POA: Diagnosis not present

## 2022-07-19 DIAGNOSIS — I10 Essential (primary) hypertension: Secondary | ICD-10-CM | POA: Diagnosis not present

## 2022-07-19 DIAGNOSIS — R29898 Other symptoms and signs involving the musculoskeletal system: Secondary | ICD-10-CM | POA: Diagnosis not present

## 2022-07-19 DIAGNOSIS — R7303 Prediabetes: Secondary | ICD-10-CM | POA: Diagnosis not present

## 2022-07-19 DIAGNOSIS — M810 Age-related osteoporosis without current pathological fracture: Secondary | ICD-10-CM | POA: Diagnosis not present

## 2022-07-19 DIAGNOSIS — F17210 Nicotine dependence, cigarettes, uncomplicated: Secondary | ICD-10-CM | POA: Diagnosis not present

## 2022-07-19 DIAGNOSIS — E7800 Pure hypercholesterolemia, unspecified: Secondary | ICD-10-CM | POA: Diagnosis not present

## 2022-07-20 DIAGNOSIS — R519 Headache, unspecified: Secondary | ICD-10-CM | POA: Diagnosis not present

## 2022-07-20 DIAGNOSIS — Z1389 Encounter for screening for other disorder: Secondary | ICD-10-CM | POA: Diagnosis not present

## 2022-07-20 DIAGNOSIS — Z982 Presence of cerebrospinal fluid drainage device: Secondary | ICD-10-CM | POA: Diagnosis not present

## 2022-07-20 DIAGNOSIS — Z0000 Encounter for general adult medical examination without abnormal findings: Secondary | ICD-10-CM | POA: Diagnosis not present

## 2022-07-27 DIAGNOSIS — M85851 Other specified disorders of bone density and structure, right thigh: Secondary | ICD-10-CM | POA: Diagnosis not present

## 2022-07-27 DIAGNOSIS — Z1231 Encounter for screening mammogram for malignant neoplasm of breast: Secondary | ICD-10-CM | POA: Diagnosis not present

## 2022-07-27 DIAGNOSIS — M85852 Other specified disorders of bone density and structure, left thigh: Secondary | ICD-10-CM | POA: Diagnosis not present

## 2022-07-27 DIAGNOSIS — Z780 Asymptomatic menopausal state: Secondary | ICD-10-CM | POA: Diagnosis not present

## 2022-08-02 DIAGNOSIS — Z982 Presence of cerebrospinal fluid drainage device: Secondary | ICD-10-CM | POA: Diagnosis not present

## 2022-08-02 DIAGNOSIS — G912 (Idiopathic) normal pressure hydrocephalus: Secondary | ICD-10-CM | POA: Diagnosis not present

## 2022-08-03 DIAGNOSIS — R29898 Other symptoms and signs involving the musculoskeletal system: Secondary | ICD-10-CM | POA: Diagnosis not present

## 2022-08-03 DIAGNOSIS — G629 Polyneuropathy, unspecified: Secondary | ICD-10-CM | POA: Diagnosis not present

## 2022-08-03 DIAGNOSIS — Z993 Dependence on wheelchair: Secondary | ICD-10-CM | POA: Diagnosis not present

## 2022-08-03 DIAGNOSIS — E039 Hypothyroidism, unspecified: Secondary | ICD-10-CM | POA: Diagnosis not present

## 2022-08-03 DIAGNOSIS — F17210 Nicotine dependence, cigarettes, uncomplicated: Secondary | ICD-10-CM | POA: Diagnosis not present

## 2022-08-03 DIAGNOSIS — E7800 Pure hypercholesterolemia, unspecified: Secondary | ICD-10-CM | POA: Diagnosis not present

## 2022-08-03 DIAGNOSIS — R7303 Prediabetes: Secondary | ICD-10-CM | POA: Diagnosis not present

## 2022-08-03 DIAGNOSIS — F419 Anxiety disorder, unspecified: Secondary | ICD-10-CM | POA: Diagnosis not present

## 2022-08-03 DIAGNOSIS — M810 Age-related osteoporosis without current pathological fracture: Secondary | ICD-10-CM | POA: Diagnosis not present

## 2022-08-03 DIAGNOSIS — I10 Essential (primary) hypertension: Secondary | ICD-10-CM | POA: Diagnosis not present

## 2022-08-16 DIAGNOSIS — S322XXA Fracture of coccyx, initial encounter for closed fracture: Secondary | ICD-10-CM | POA: Diagnosis not present

## 2022-08-16 DIAGNOSIS — S3210XA Unspecified fracture of sacrum, initial encounter for closed fracture: Secondary | ICD-10-CM | POA: Diagnosis not present

## 2022-08-16 DIAGNOSIS — M533 Sacrococcygeal disorders, not elsewhere classified: Secondary | ICD-10-CM | POA: Diagnosis not present

## 2022-08-23 DIAGNOSIS — Z982 Presence of cerebrospinal fluid drainage device: Secondary | ICD-10-CM | POA: Diagnosis not present

## 2022-08-23 DIAGNOSIS — G912 (Idiopathic) normal pressure hydrocephalus: Secondary | ICD-10-CM | POA: Diagnosis not present

## 2022-09-03 DIAGNOSIS — M810 Age-related osteoporosis without current pathological fracture: Secondary | ICD-10-CM | POA: Diagnosis not present

## 2022-09-03 DIAGNOSIS — R7303 Prediabetes: Secondary | ICD-10-CM | POA: Diagnosis not present

## 2022-09-03 DIAGNOSIS — E7800 Pure hypercholesterolemia, unspecified: Secondary | ICD-10-CM | POA: Diagnosis not present

## 2022-09-03 DIAGNOSIS — G629 Polyneuropathy, unspecified: Secondary | ICD-10-CM | POA: Diagnosis not present

## 2022-09-03 DIAGNOSIS — I10 Essential (primary) hypertension: Secondary | ICD-10-CM | POA: Diagnosis not present

## 2022-09-03 DIAGNOSIS — E039 Hypothyroidism, unspecified: Secondary | ICD-10-CM | POA: Diagnosis not present

## 2022-09-03 DIAGNOSIS — R29898 Other symptoms and signs involving the musculoskeletal system: Secondary | ICD-10-CM | POA: Diagnosis not present

## 2022-09-03 DIAGNOSIS — Z993 Dependence on wheelchair: Secondary | ICD-10-CM | POA: Diagnosis not present

## 2022-09-03 DIAGNOSIS — F419 Anxiety disorder, unspecified: Secondary | ICD-10-CM | POA: Diagnosis not present

## 2022-09-03 DIAGNOSIS — F17210 Nicotine dependence, cigarettes, uncomplicated: Secondary | ICD-10-CM | POA: Diagnosis not present

## 2022-09-20 DIAGNOSIS — G912 (Idiopathic) normal pressure hydrocephalus: Secondary | ICD-10-CM | POA: Diagnosis not present

## 2022-09-20 DIAGNOSIS — Z982 Presence of cerebrospinal fluid drainage device: Secondary | ICD-10-CM | POA: Diagnosis not present

## 2022-10-04 DIAGNOSIS — M810 Age-related osteoporosis without current pathological fracture: Secondary | ICD-10-CM | POA: Diagnosis not present

## 2022-10-04 DIAGNOSIS — E7800 Pure hypercholesterolemia, unspecified: Secondary | ICD-10-CM | POA: Diagnosis not present

## 2022-10-04 DIAGNOSIS — G629 Polyneuropathy, unspecified: Secondary | ICD-10-CM | POA: Diagnosis not present

## 2022-10-04 DIAGNOSIS — I10 Essential (primary) hypertension: Secondary | ICD-10-CM | POA: Diagnosis not present

## 2022-10-04 DIAGNOSIS — F419 Anxiety disorder, unspecified: Secondary | ICD-10-CM | POA: Diagnosis not present

## 2022-10-04 DIAGNOSIS — E039 Hypothyroidism, unspecified: Secondary | ICD-10-CM | POA: Diagnosis not present

## 2022-10-04 DIAGNOSIS — Z993 Dependence on wheelchair: Secondary | ICD-10-CM | POA: Diagnosis not present

## 2022-10-04 DIAGNOSIS — R7303 Prediabetes: Secondary | ICD-10-CM | POA: Diagnosis not present

## 2022-10-04 DIAGNOSIS — R29898 Other symptoms and signs involving the musculoskeletal system: Secondary | ICD-10-CM | POA: Diagnosis not present

## 2022-10-04 DIAGNOSIS — F17210 Nicotine dependence, cigarettes, uncomplicated: Secondary | ICD-10-CM | POA: Diagnosis not present

## 2022-10-29 ENCOUNTER — Telehealth: Payer: Self-pay | Admitting: Acute Care

## 2022-10-30 NOTE — Telephone Encounter (Signed)
Noted  

## 2022-11-02 ENCOUNTER — Other Ambulatory Visit: Payer: Self-pay | Admitting: *Deleted

## 2022-11-02 DIAGNOSIS — Z122 Encounter for screening for malignant neoplasm of respiratory organs: Secondary | ICD-10-CM

## 2022-11-02 DIAGNOSIS — F17210 Nicotine dependence, cigarettes, uncomplicated: Secondary | ICD-10-CM

## 2022-11-02 DIAGNOSIS — Z87891 Personal history of nicotine dependence: Secondary | ICD-10-CM

## 2022-11-07 DIAGNOSIS — R8290 Unspecified abnormal findings in urine: Secondary | ICD-10-CM | POA: Diagnosis not present

## 2022-11-07 DIAGNOSIS — N898 Other specified noninflammatory disorders of vagina: Secondary | ICD-10-CM | POA: Diagnosis not present

## 2022-11-16 DIAGNOSIS — R350 Frequency of micturition: Secondary | ICD-10-CM | POA: Diagnosis not present

## 2022-11-19 ENCOUNTER — Other Ambulatory Visit: Payer: PPO

## 2022-11-28 DIAGNOSIS — F419 Anxiety disorder, unspecified: Secondary | ICD-10-CM | POA: Diagnosis not present

## 2022-11-28 DIAGNOSIS — R350 Frequency of micturition: Secondary | ICD-10-CM | POA: Diagnosis not present

## 2022-11-28 DIAGNOSIS — R8290 Unspecified abnormal findings in urine: Secondary | ICD-10-CM | POA: Diagnosis not present

## 2022-12-07 ENCOUNTER — Ambulatory Visit
Admission: RE | Admit: 2022-12-07 | Discharge: 2022-12-07 | Disposition: A | Discharge status: Home / Self Care | Payer: PPO | Source: Ambulatory Visit | Attending: Acute Care | Admitting: Acute Care

## 2022-12-07 DIAGNOSIS — F17210 Nicotine dependence, cigarettes, uncomplicated: Secondary | ICD-10-CM | POA: Diagnosis not present

## 2022-12-07 DIAGNOSIS — Z122 Encounter for screening for malignant neoplasm of respiratory organs: Secondary | ICD-10-CM

## 2022-12-07 DIAGNOSIS — Z87891 Personal history of nicotine dependence: Secondary | ICD-10-CM

## 2022-12-11 ENCOUNTER — Other Ambulatory Visit: Payer: Self-pay | Admitting: Acute Care

## 2022-12-11 DIAGNOSIS — F17210 Nicotine dependence, cigarettes, uncomplicated: Secondary | ICD-10-CM

## 2022-12-11 DIAGNOSIS — Z122 Encounter for screening for malignant neoplasm of respiratory organs: Secondary | ICD-10-CM

## 2022-12-11 DIAGNOSIS — Z87891 Personal history of nicotine dependence: Secondary | ICD-10-CM

## 2022-12-21 DIAGNOSIS — N390 Urinary tract infection, site not specified: Secondary | ICD-10-CM | POA: Diagnosis not present

## 2022-12-21 DIAGNOSIS — Z6828 Body mass index (BMI) 28.0-28.9, adult: Secondary | ICD-10-CM | POA: Diagnosis not present

## 2023-01-11 DIAGNOSIS — F17210 Nicotine dependence, cigarettes, uncomplicated: Secondary | ICD-10-CM | POA: Diagnosis not present

## 2023-01-11 DIAGNOSIS — I10 Essential (primary) hypertension: Secondary | ICD-10-CM | POA: Diagnosis not present

## 2023-01-11 DIAGNOSIS — Z993 Dependence on wheelchair: Secondary | ICD-10-CM | POA: Diagnosis not present

## 2023-01-11 DIAGNOSIS — F419 Anxiety disorder, unspecified: Secondary | ICD-10-CM | POA: Diagnosis not present

## 2023-01-11 DIAGNOSIS — E039 Hypothyroidism, unspecified: Secondary | ICD-10-CM | POA: Diagnosis not present

## 2023-01-11 DIAGNOSIS — Z9989 Dependence on other enabling machines and devices: Secondary | ICD-10-CM | POA: Diagnosis not present

## 2023-01-11 DIAGNOSIS — R7303 Prediabetes: Secondary | ICD-10-CM | POA: Diagnosis not present

## 2023-01-11 DIAGNOSIS — N390 Urinary tract infection, site not specified: Secondary | ICD-10-CM | POA: Diagnosis not present

## 2023-01-11 DIAGNOSIS — G629 Polyneuropathy, unspecified: Secondary | ICD-10-CM | POA: Diagnosis not present

## 2023-01-11 DIAGNOSIS — E7800 Pure hypercholesterolemia, unspecified: Secondary | ICD-10-CM | POA: Diagnosis not present

## 2023-01-22 DIAGNOSIS — Z6827 Body mass index (BMI) 27.0-27.9, adult: Secondary | ICD-10-CM | POA: Diagnosis not present

## 2023-01-22 DIAGNOSIS — L98491 Non-pressure chronic ulcer of skin of other sites limited to breakdown of skin: Secondary | ICD-10-CM | POA: Diagnosis not present

## 2023-01-26 DIAGNOSIS — G4700 Insomnia, unspecified: Secondary | ICD-10-CM | POA: Diagnosis not present

## 2023-01-26 DIAGNOSIS — Z9181 History of falling: Secondary | ICD-10-CM | POA: Diagnosis not present

## 2023-01-26 DIAGNOSIS — E559 Vitamin D deficiency, unspecified: Secondary | ICD-10-CM | POA: Diagnosis not present

## 2023-01-26 DIAGNOSIS — Z792 Long term (current) use of antibiotics: Secondary | ICD-10-CM | POA: Diagnosis not present

## 2023-01-26 DIAGNOSIS — I700 Atherosclerosis of aorta: Secondary | ICD-10-CM | POA: Diagnosis not present

## 2023-01-26 DIAGNOSIS — F17210 Nicotine dependence, cigarettes, uncomplicated: Secondary | ICD-10-CM | POA: Diagnosis not present

## 2023-01-26 DIAGNOSIS — F419 Anxiety disorder, unspecified: Secondary | ICD-10-CM | POA: Diagnosis not present

## 2023-01-26 DIAGNOSIS — Z86010 Personal history of colonic polyps: Secondary | ICD-10-CM | POA: Diagnosis not present

## 2023-01-26 DIAGNOSIS — I10 Essential (primary) hypertension: Secondary | ICD-10-CM | POA: Diagnosis not present

## 2023-01-26 DIAGNOSIS — M810 Age-related osteoporosis without current pathological fracture: Secondary | ICD-10-CM | POA: Diagnosis not present

## 2023-01-26 DIAGNOSIS — E7800 Pure hypercholesterolemia, unspecified: Secondary | ICD-10-CM | POA: Diagnosis not present

## 2023-01-26 DIAGNOSIS — G629 Polyneuropathy, unspecified: Secondary | ICD-10-CM | POA: Diagnosis not present

## 2023-01-26 DIAGNOSIS — E039 Hypothyroidism, unspecified: Secondary | ICD-10-CM | POA: Diagnosis not present

## 2023-01-26 DIAGNOSIS — J439 Emphysema, unspecified: Secondary | ICD-10-CM | POA: Diagnosis not present

## 2023-01-26 DIAGNOSIS — L98491 Non-pressure chronic ulcer of skin of other sites limited to breakdown of skin: Secondary | ICD-10-CM | POA: Diagnosis not present

## 2023-01-26 DIAGNOSIS — R7303 Prediabetes: Secondary | ICD-10-CM | POA: Diagnosis not present

## 2023-02-05 DIAGNOSIS — L98491 Non-pressure chronic ulcer of skin of other sites limited to breakdown of skin: Secondary | ICD-10-CM | POA: Diagnosis not present

## 2023-02-05 DIAGNOSIS — I700 Atherosclerosis of aorta: Secondary | ICD-10-CM | POA: Diagnosis not present

## 2023-02-05 DIAGNOSIS — G629 Polyneuropathy, unspecified: Secondary | ICD-10-CM | POA: Diagnosis not present

## 2023-02-05 DIAGNOSIS — Z792 Long term (current) use of antibiotics: Secondary | ICD-10-CM | POA: Diagnosis not present

## 2023-02-05 DIAGNOSIS — M810 Age-related osteoporosis without current pathological fracture: Secondary | ICD-10-CM | POA: Diagnosis not present

## 2023-02-05 DIAGNOSIS — Z86010 Personal history of colonic polyps: Secondary | ICD-10-CM | POA: Diagnosis not present

## 2023-02-05 DIAGNOSIS — F419 Anxiety disorder, unspecified: Secondary | ICD-10-CM | POA: Diagnosis not present

## 2023-02-05 DIAGNOSIS — F17210 Nicotine dependence, cigarettes, uncomplicated: Secondary | ICD-10-CM | POA: Diagnosis not present

## 2023-02-05 DIAGNOSIS — Z9181 History of falling: Secondary | ICD-10-CM | POA: Diagnosis not present

## 2023-02-05 DIAGNOSIS — R7303 Prediabetes: Secondary | ICD-10-CM | POA: Diagnosis not present

## 2023-02-05 DIAGNOSIS — E039 Hypothyroidism, unspecified: Secondary | ICD-10-CM | POA: Diagnosis not present

## 2023-02-05 DIAGNOSIS — I10 Essential (primary) hypertension: Secondary | ICD-10-CM | POA: Diagnosis not present

## 2023-02-05 DIAGNOSIS — E559 Vitamin D deficiency, unspecified: Secondary | ICD-10-CM | POA: Diagnosis not present

## 2023-02-05 DIAGNOSIS — E7800 Pure hypercholesterolemia, unspecified: Secondary | ICD-10-CM | POA: Diagnosis not present

## 2023-02-05 DIAGNOSIS — J439 Emphysema, unspecified: Secondary | ICD-10-CM | POA: Diagnosis not present

## 2023-02-05 DIAGNOSIS — G4700 Insomnia, unspecified: Secondary | ICD-10-CM | POA: Diagnosis not present

## 2023-02-17 DIAGNOSIS — I700 Atherosclerosis of aorta: Secondary | ICD-10-CM | POA: Diagnosis not present

## 2023-02-17 DIAGNOSIS — G629 Polyneuropathy, unspecified: Secondary | ICD-10-CM | POA: Diagnosis not present

## 2023-02-17 DIAGNOSIS — Z86010 Personal history of colonic polyps: Secondary | ICD-10-CM | POA: Diagnosis not present

## 2023-02-17 DIAGNOSIS — E559 Vitamin D deficiency, unspecified: Secondary | ICD-10-CM | POA: Diagnosis not present

## 2023-02-17 DIAGNOSIS — E039 Hypothyroidism, unspecified: Secondary | ICD-10-CM | POA: Diagnosis not present

## 2023-02-17 DIAGNOSIS — M810 Age-related osteoporosis without current pathological fracture: Secondary | ICD-10-CM | POA: Diagnosis not present

## 2023-02-17 DIAGNOSIS — G4700 Insomnia, unspecified: Secondary | ICD-10-CM | POA: Diagnosis not present

## 2023-02-17 DIAGNOSIS — I10 Essential (primary) hypertension: Secondary | ICD-10-CM | POA: Diagnosis not present

## 2023-02-17 DIAGNOSIS — R7303 Prediabetes: Secondary | ICD-10-CM | POA: Diagnosis not present

## 2023-02-17 DIAGNOSIS — E7800 Pure hypercholesterolemia, unspecified: Secondary | ICD-10-CM | POA: Diagnosis not present

## 2023-02-17 DIAGNOSIS — J439 Emphysema, unspecified: Secondary | ICD-10-CM | POA: Diagnosis not present

## 2023-02-17 DIAGNOSIS — F419 Anxiety disorder, unspecified: Secondary | ICD-10-CM | POA: Diagnosis not present

## 2023-02-17 DIAGNOSIS — L98491 Non-pressure chronic ulcer of skin of other sites limited to breakdown of skin: Secondary | ICD-10-CM | POA: Diagnosis not present

## 2023-02-17 DIAGNOSIS — Z9181 History of falling: Secondary | ICD-10-CM | POA: Diagnosis not present

## 2023-02-17 DIAGNOSIS — Z792 Long term (current) use of antibiotics: Secondary | ICD-10-CM | POA: Diagnosis not present

## 2023-02-17 DIAGNOSIS — F17210 Nicotine dependence, cigarettes, uncomplicated: Secondary | ICD-10-CM | POA: Diagnosis not present

## 2023-03-28 DIAGNOSIS — S46212A Strain of muscle, fascia and tendon of other parts of biceps, left arm, initial encounter: Secondary | ICD-10-CM | POA: Diagnosis not present

## 2023-04-01 DIAGNOSIS — I10 Essential (primary) hypertension: Secondary | ICD-10-CM | POA: Diagnosis not present

## 2023-04-01 DIAGNOSIS — Z6829 Body mass index (BMI) 29.0-29.9, adult: Secondary | ICD-10-CM | POA: Diagnosis not present

## 2023-04-01 DIAGNOSIS — F419 Anxiety disorder, unspecified: Secondary | ICD-10-CM | POA: Diagnosis not present

## 2023-04-01 DIAGNOSIS — F17210 Nicotine dependence, cigarettes, uncomplicated: Secondary | ICD-10-CM | POA: Diagnosis not present

## 2023-04-01 DIAGNOSIS — N898 Other specified noninflammatory disorders of vagina: Secondary | ICD-10-CM | POA: Diagnosis not present

## 2023-04-01 DIAGNOSIS — N390 Urinary tract infection, site not specified: Secondary | ICD-10-CM | POA: Diagnosis not present

## 2023-04-01 DIAGNOSIS — R7303 Prediabetes: Secondary | ICD-10-CM | POA: Diagnosis not present

## 2023-04-01 DIAGNOSIS — J432 Centrilobular emphysema: Secondary | ICD-10-CM | POA: Diagnosis not present

## 2023-04-01 DIAGNOSIS — I700 Atherosclerosis of aorta: Secondary | ICD-10-CM | POA: Diagnosis not present

## 2023-04-01 DIAGNOSIS — E039 Hypothyroidism, unspecified: Secondary | ICD-10-CM | POA: Diagnosis not present

## 2023-04-08 DIAGNOSIS — S46212A Strain of muscle, fascia and tendon of other parts of biceps, left arm, initial encounter: Secondary | ICD-10-CM | POA: Diagnosis not present

## 2023-04-08 DIAGNOSIS — M19012 Primary osteoarthritis, left shoulder: Secondary | ICD-10-CM | POA: Diagnosis not present

## 2023-04-12 DIAGNOSIS — S46212A Strain of muscle, fascia and tendon of other parts of biceps, left arm, initial encounter: Secondary | ICD-10-CM | POA: Diagnosis not present

## 2023-06-18 DIAGNOSIS — M272 Inflammatory conditions of jaws: Secondary | ICD-10-CM | POA: Diagnosis not present

## 2023-06-18 DIAGNOSIS — K1120 Sialoadenitis, unspecified: Secondary | ICD-10-CM | POA: Diagnosis not present

## 2023-06-18 DIAGNOSIS — M795 Residual foreign body in soft tissue: Secondary | ICD-10-CM | POA: Diagnosis not present

## 2023-07-03 DIAGNOSIS — I10 Essential (primary) hypertension: Secondary | ICD-10-CM | POA: Diagnosis not present

## 2023-07-03 DIAGNOSIS — F419 Anxiety disorder, unspecified: Secondary | ICD-10-CM | POA: Diagnosis not present

## 2023-07-03 DIAGNOSIS — G629 Polyneuropathy, unspecified: Secondary | ICD-10-CM | POA: Diagnosis not present

## 2023-07-03 DIAGNOSIS — N390 Urinary tract infection, site not specified: Secondary | ICD-10-CM | POA: Diagnosis not present

## 2023-07-03 DIAGNOSIS — Z993 Dependence on wheelchair: Secondary | ICD-10-CM | POA: Diagnosis not present

## 2023-07-24 DIAGNOSIS — Z0000 Encounter for general adult medical examination without abnormal findings: Secondary | ICD-10-CM | POA: Diagnosis not present

## 2023-07-24 DIAGNOSIS — Z9989 Dependence on other enabling machines and devices: Secondary | ICD-10-CM | POA: Diagnosis not present

## 2023-07-24 DIAGNOSIS — Z1389 Encounter for screening for other disorder: Secondary | ICD-10-CM | POA: Diagnosis not present

## 2023-07-24 DIAGNOSIS — E663 Overweight: Secondary | ICD-10-CM | POA: Diagnosis not present

## 2023-07-24 DIAGNOSIS — Z23 Encounter for immunization: Secondary | ICD-10-CM | POA: Diagnosis not present

## 2023-07-25 DIAGNOSIS — N9489 Other specified conditions associated with female genital organs and menstrual cycle: Secondary | ICD-10-CM | POA: Diagnosis not present

## 2023-07-25 DIAGNOSIS — R399 Unspecified symptoms and signs involving the genitourinary system: Secondary | ICD-10-CM | POA: Diagnosis not present

## 2023-07-25 DIAGNOSIS — Z6829 Body mass index (BMI) 29.0-29.9, adult: Secondary | ICD-10-CM | POA: Diagnosis not present

## 2023-07-26 DIAGNOSIS — R399 Unspecified symptoms and signs involving the genitourinary system: Secondary | ICD-10-CM | POA: Diagnosis not present

## 2023-08-06 DIAGNOSIS — N390 Urinary tract infection, site not specified: Secondary | ICD-10-CM | POA: Diagnosis not present

## 2023-08-06 DIAGNOSIS — R350 Frequency of micturition: Secondary | ICD-10-CM | POA: Diagnosis not present

## 2023-08-06 DIAGNOSIS — F17210 Nicotine dependence, cigarettes, uncomplicated: Secondary | ICD-10-CM | POA: Diagnosis not present

## 2023-09-04 DIAGNOSIS — N390 Urinary tract infection, site not specified: Secondary | ICD-10-CM | POA: Diagnosis not present

## 2023-09-04 DIAGNOSIS — N898 Other specified noninflammatory disorders of vagina: Secondary | ICD-10-CM | POA: Diagnosis not present

## 2023-09-04 DIAGNOSIS — R350 Frequency of micturition: Secondary | ICD-10-CM | POA: Diagnosis not present

## 2023-09-04 DIAGNOSIS — N3945 Continuous leakage: Secondary | ICD-10-CM | POA: Diagnosis not present

## 2023-09-04 DIAGNOSIS — F17210 Nicotine dependence, cigarettes, uncomplicated: Secondary | ICD-10-CM | POA: Diagnosis not present

## 2023-09-04 DIAGNOSIS — N949 Unspecified condition associated with female genital organs and menstrual cycle: Secondary | ICD-10-CM | POA: Diagnosis not present

## 2023-09-26 ENCOUNTER — Ambulatory Visit: Payer: PPO | Admitting: Urology

## 2023-10-01 ENCOUNTER — Ambulatory Visit: Payer: PPO | Admitting: Urology

## 2023-10-02 ENCOUNTER — Ambulatory Visit: Payer: PPO | Admitting: Urology

## 2023-10-02 ENCOUNTER — Encounter: Payer: Self-pay | Admitting: Urology

## 2023-10-02 VITALS — BP 148/79 | HR 80 | Ht <= 58 in | Wt 140.0 lb

## 2023-10-02 DIAGNOSIS — R350 Frequency of micturition: Secondary | ICD-10-CM

## 2023-10-02 DIAGNOSIS — Z87440 Personal history of urinary (tract) infections: Secondary | ICD-10-CM

## 2023-10-02 DIAGNOSIS — R8271 Bacteriuria: Secondary | ICD-10-CM | POA: Diagnosis not present

## 2023-10-02 DIAGNOSIS — N3941 Urge incontinence: Secondary | ICD-10-CM

## 2023-10-02 DIAGNOSIS — R32 Unspecified urinary incontinence: Secondary | ICD-10-CM

## 2023-10-02 DIAGNOSIS — R399 Unspecified symptoms and signs involving the genitourinary system: Secondary | ICD-10-CM

## 2023-10-02 DIAGNOSIS — N952 Postmenopausal atrophic vaginitis: Secondary | ICD-10-CM | POA: Diagnosis not present

## 2023-10-02 LAB — URINALYSIS, ROUTINE W REFLEX MICROSCOPIC
Bilirubin, UA: NEGATIVE
Glucose, UA: NEGATIVE
Ketones, UA: NEGATIVE
Nitrite, UA: POSITIVE — AB
Protein,UA: NEGATIVE
Specific Gravity, UA: 1.015 (ref 1.005–1.030)
Urobilinogen, Ur: 0.2 mg/dL (ref 0.2–1.0)
pH, UA: 7 (ref 5.0–7.5)

## 2023-10-02 LAB — BLADDER SCAN AMB NON-IMAGING

## 2023-10-02 LAB — MICROSCOPIC EXAMINATION: WBC, UA: >30 /[HPF] — ABNORMAL HIGH (ref 0–5)

## 2023-10-02 MED ORDER — ESTRADIOL 0.1 MG/GM VA CREA: TOPICAL_CREAM | VAGINAL | 3 refills | Status: DC

## 2023-10-02 NOTE — Progress Notes (Signed)
H&P  Chief Complaint: Urinary infections, urinary incontinence  History of Present Illness: 77 year old female comes in today for an appointment with her husband.  She has worsening urinary frequency, urgency and urgency incontinence.  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  She is able to walk with a walker, but has significantly impaired ambulation.  This inhibits her ability to get to the toilet in time when she has urgency.  She has subsequent leakage.  She also has been treated for several urinary tract infections.  Main issue is having foul-smelling urine.  This is usually not associated with dysuria, gross hematuria or change in lower urinary tract symptoms.  These are treated with antibiotics.  She also complains of an irritated area inside her right labial fold.  This has been there on and off for a while.  She states that she has been treated with nystatin cream with some improvement of the irritation.  Past Medical History:  Diagnosis Date   Anemia    past history during menses   Cataract    Complication of anesthesia    History of seasonal allergies    Hypothyroidism    PONV (postoperative nausea and vomiting)     Past Surgical History:  Procedure Laterality Date   Bartholins Bilateral    COLONOSCOPY WITH PROPOFOL N/A 12/12/2015   Procedure: COLONOSCOPY WITH PROPOFOL;  Surgeon: Charolett Bumpers, MD;  Location: [facility];  Service: Endoscopy;  Laterality: N/A;   SEPTOPLASTY Bilateral    TUBAL LIGATION     with Bartholin's cyst    Home Medications:  Allergies as of 10/02/2023       Reactions   Codeine Nausea Only   Sulfa Antibiotics Other (See Comments)   Made her feel bad   Sulfa Antibiotics    Pt stated, "It makes me feel worse than when I took it"        Medication List        Accurate as of October 02, 2023 10:04 AM. If you have any questions, ask your nurse or doctor.           alendronate 70 MG tablet Commonly known as: FOSAMAX Take 70 mg by mouth once a week.   CALCIUM + D PO Take 2 tablets by mouth 2 (two) times daily.   calcium chloride in sodium chloride 0.9 % 50 mL cal mag zinc- 4 tablets   CALCIUM PO Take 600 mg by mouth daily. Takes 2 a day   gabapentin 100 MG capsule Commonly known as: NEURONTIN Take 1 capsule (100 mg total) by mouth at bedtime.   hydrochlorothiazide 12.5 MG capsule Commonly known as: MICROZIDE 1 tablet   Influenza vac split quadrivalent PF 0.5 ML injection Commonly known as: FLUZONE HIGH-DOSE Fluzone High-Dose 2018-2019 (PF) 180 mcg/0.5 mL intramuscular syringe  TO BE ADMINISTERED BY PHARMACIST FOR IMMUNIZATION   levothyroxine 75 MCG tablet Commonly known as: SYNTHROID Take 75 mcg by mouth daily before breakfast.   levothyroxine 75 MCG tablet Commonly known as: SYNTHROID Take 75 mcg by mouth daily.   simvastatin 20 MG tablet Commonly known as: ZOCOR Take 20 mg by mouth at bedtime.   simvastatin 20 MG tablet Commonly known as: ZOCOR Take 20 mg by mouth daily.   VITAMIN D PO Take 1-2 tablets by mouth 2 (two) times daily.   VITAMIN D PO Take 1,000 mg by mouth daily.        Allergies:  Allergies  Allergen Reactions   Codeine Nausea Only   Sulfa Antibiotics Other (See Comments)    Made her feel bad   Sulfa Antibiotics     Pt stated, "It makes me feel worse than when I took it"    Family History  Problem Relation Age of Onset   Lung disease Mother    Heart failure Father    Hypertension Brother     Social History:  reports that she has been smoking cigarettes. She has a 44.3 pack-year smoking history. She has never used smokeless tobacco. She reports that she does not drink alcohol and does not use drugs.  ROS: A complete review of systems was performed.  All systems are negative except for pertinent findings as noted.  Physical Exam:  Vital signs in last 24 hours: There were no vitals  taken for this visit. Constitutional:  Alert and oriented, No acute distress.  She is in a wheelchair. Cardiovascular: Regular rate  Respiratory: Normal respiratory effort GI: Abdomen is obese, soft.  Nontender. Genitourinary: Atrophic vaginal changes noted.  I see no external or internal lesions.  There is good bladder support.  Urethral meatus normal. Lymphatic: No lymphadenopathy Neurologic: Grossly intact, no focal deficits Psychiatric: Normal mood and affect  I have reviewed notes from referring/previous physicians  I have reviewed urinalysis results  I have independently reviewed p bladder scan-residual volume 130 mL  I have reviewed prior urine culture   Impression/Assessment:  1.  Bacteriuria.  This is present today although patient not symptomatic of UTI.  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  Vaginal atrophic changes    Plan:  1.  I discussed with the patient and her husband the fact that colonization is common in elderly females, and should not, if asymptomatic, be treated with antibiotics  2.  I will start her on estrogen vaginal cream 2 nights a week  3.  Overactive bladder guide sheet given to the patient and her husband  4.  I will have her come back in about 6 weeks, at the same time as her husband's appointment, for recheck

## 2023-10-04 DIAGNOSIS — F419 Anxiety disorder, unspecified: Secondary | ICD-10-CM | POA: Diagnosis not present

## 2023-10-04 DIAGNOSIS — I10 Essential (primary) hypertension: Secondary | ICD-10-CM | POA: Diagnosis not present

## 2023-10-04 DIAGNOSIS — G629 Polyneuropathy, unspecified: Secondary | ICD-10-CM | POA: Diagnosis not present

## 2023-10-04 DIAGNOSIS — M8580 Other specified disorders of bone density and structure, unspecified site: Secondary | ICD-10-CM | POA: Diagnosis not present

## 2023-10-04 DIAGNOSIS — E7800 Pure hypercholesterolemia, unspecified: Secondary | ICD-10-CM | POA: Diagnosis not present

## 2023-10-04 DIAGNOSIS — N390 Urinary tract infection, site not specified: Secondary | ICD-10-CM | POA: Diagnosis not present

## 2023-10-04 DIAGNOSIS — E039 Hypothyroidism, unspecified: Secondary | ICD-10-CM | POA: Diagnosis not present

## 2023-10-04 DIAGNOSIS — R7303 Prediabetes: Secondary | ICD-10-CM | POA: Diagnosis not present

## 2023-10-04 DIAGNOSIS — Z23 Encounter for immunization: Secondary | ICD-10-CM | POA: Diagnosis not present

## 2023-10-07 ENCOUNTER — Ambulatory Visit: Payer: PPO | Admitting: Urology

## 2023-11-11 ENCOUNTER — Ambulatory Visit: Payer: PPO | Admitting: Urology

## 2023-11-11 VITALS — BP 153/75 | HR 73

## 2023-11-11 DIAGNOSIS — R399 Unspecified symptoms and signs involving the genitourinary system: Secondary | ICD-10-CM | POA: Diagnosis not present

## 2023-11-11 DIAGNOSIS — R32 Unspecified urinary incontinence: Secondary | ICD-10-CM

## 2023-11-11 DIAGNOSIS — N952 Postmenopausal atrophic vaginitis: Secondary | ICD-10-CM | POA: Diagnosis not present

## 2023-11-11 NOTE — Progress Notes (Signed)
History of Present Illness: Kendra Mitchell is a 77 y.o. year old female who returns today for follow-up.  Initial visit several weeks ago.  She had labial irritation without obvious skin changes externally.  She was placed on estrogen cream.  She complained of foul-smelling urine but her symptoms were not indicative of UTI, although pyuria was present.  She was also instructed on [facility] behavioral management.  She was given estrogen cream.  Over the few weeks since her last visit she has used all of the cream-despite my instructions to use a small amount 2-3 nights a week.  She still has "a sore on my vagina".  She actually has continued to leak a fair amount, has to use 2 pull-ups a day.  They are saturated when she changes them.  Past Medical History:  Diagnosis Date   Anemia    past history during menses   Cataract    Complication of anesthesia    History of seasonal allergies    Hypothyroidism    PONV (postoperative nausea and vomiting)     Past Surgical History:  Procedure Laterality Date   Bartholins Bilateral    COLONOSCOPY WITH PROPOFOL N/A 12/12/2015   Procedure: COLONOSCOPY WITH PROPOFOL;  Surgeon: Charolett Bumpers, MD;  Location: [facility];  Service: Endoscopy;  Laterality: N/A;   SEPTOPLASTY Bilateral    TUBAL LIGATION     with Bartholin's cyst    Home Medications:  (Not in a hospital admission)   Allergies:  Allergies  Allergen Reactions   Codeine Nausea Only   Sulfa Antibiotics Other (See Comments)    Made her feel bad   Sulfa Antibiotics     Pt stated, "It makes me feel worse than when I took it"    Family History  Problem Relation Age of Onset   Lung disease Mother    Heart failure Father    Hypertension Brother     Social History:  reports that she has been smoking cigarettes. She has a 44.3 pack-year smoking history. She has never used smokeless tobacco. She reports that she does not drink alcohol and does not use drugs.  ROS: A complete  review of systems was performed.  All systems are negative except for pertinent findings as noted.  Physical Exam:  Vital signs in last 24 hours: @VSRANGES @ General:  Alert and oriented, No acute distress HEENT: Normocephalic, atraumatic Neck: No JVD or lymphadenopathy Cardiovascular: Regular rate  Lungs: Normal inspiratory/expiratory excursion Extremities: No edema Neurologic: Grossly intact  I have reviewed prior pt notes  I have reviewed prior urine culture   Impression/Assessment:  1.  Labial/vaginal irritation, persistent despite use of estrogen cream.  I think she needs a gynecologic evaluation for this    Plan:  1.  I told her to limit the amount of estrogen cream she uses  2.  I will give her samples of Gemtesa 75 mg, for 1 month  3.  I will have her give [place] a call in a month to see how she is doing on this medicine.  If doing well, switch to generic  Chelsea Aus 11/11/2023, 9:27 AM  Bertram Millard. Shayna Eblen MD

## 2023-11-11 NOTE — Addendum Note (Signed)
Addended by: Carolin Coy on: 11/11/2023 02:57 PM   Modules accepted: Orders

## 2023-11-13 ENCOUNTER — Other Ambulatory Visit: Payer: Self-pay | Admitting: Acute Care

## 2023-11-13 DIAGNOSIS — Z122 Encounter for screening for malignant neoplasm of respiratory organs: Secondary | ICD-10-CM

## 2023-11-13 DIAGNOSIS — F17210 Nicotine dependence, cigarettes, uncomplicated: Secondary | ICD-10-CM

## 2023-11-13 DIAGNOSIS — Z87891 Personal history of nicotine dependence: Secondary | ICD-10-CM

## 2023-11-21 DIAGNOSIS — L570 Actinic keratosis: Secondary | ICD-10-CM | POA: Diagnosis not present

## 2023-12-12 ENCOUNTER — Ambulatory Visit
Admission: RE | Admit: 2023-12-12 | Discharge: 2023-12-12 | Disposition: A | Discharge status: Home / Self Care | Payer: PPO | Source: Ambulatory Visit | Attending: Acute Care | Admitting: Acute Care

## 2023-12-12 DIAGNOSIS — Z122 Encounter for screening for malignant neoplasm of respiratory organs: Secondary | ICD-10-CM

## 2023-12-12 DIAGNOSIS — Z87891 Personal history of nicotine dependence: Secondary | ICD-10-CM

## 2023-12-12 DIAGNOSIS — F17210 Nicotine dependence, cigarettes, uncomplicated: Secondary | ICD-10-CM

## 2023-12-18 DIAGNOSIS — N390 Urinary tract infection, site not specified: Secondary | ICD-10-CM | POA: Diagnosis not present

## 2024-01-07 DIAGNOSIS — R300 Dysuria: Secondary | ICD-10-CM | POA: Diagnosis not present

## 2024-02-04 ENCOUNTER — Encounter: Payer: PPO | Admitting: Obstetrics and Gynecology

## 2024-03-04 DIAGNOSIS — R1110 Vomiting, unspecified: Secondary | ICD-10-CM | POA: Diagnosis not present

## 2024-03-04 DIAGNOSIS — J209 Acute bronchitis, unspecified: Secondary | ICD-10-CM | POA: Diagnosis not present

## 2024-03-04 DIAGNOSIS — F17210 Nicotine dependence, cigarettes, uncomplicated: Secondary | ICD-10-CM | POA: Diagnosis not present

## 2024-03-04 DIAGNOSIS — J432 Centrilobular emphysema: Secondary | ICD-10-CM | POA: Diagnosis not present

## 2024-03-23 ENCOUNTER — Ambulatory Visit: Admitting: Obstetrics and Gynecology

## 2024-03-23 VITALS — BP 143/60 | HR 79 | Ht <= 58 in | Wt 142.0 lb

## 2024-03-23 DIAGNOSIS — N958 Other specified menopausal and perimenopausal disorders: Secondary | ICD-10-CM

## 2024-03-23 DIAGNOSIS — Z1231 Encounter for screening mammogram for malignant neoplasm of breast: Secondary | ICD-10-CM

## 2024-03-23 NOTE — Patient Instructions (Addendum)
 Apply vaginal estrogen before bed every night for 2 weeks and then 3 nights a week afterwards. Typically see improvement in about 2-3 months. If you don't have improvement with the vaginal cream, we can try a different medication

## 2024-03-23 NOTE — Progress Notes (Signed)
 NEW GYNECOLOGY PATIENT Patient name: Kendra Mitchell MRN [id number]  Date of birth: May 13, 1946 Chief Complaint:   Gynecologic Exam (Sore on R side that itches and burns.  )     History:  Kendra Mitchell is a 78 y.o. No obstetric history on file. 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   Patient reports having vaginal atrophy symptoms for several months.  Notes that she has been using nystatin cream without improvement.  Reports having completed use of nystatin cream.  Has noticed that she will have intermittent improvement, but no complete resolution of the sensation.  When asked about prior vaginal estrogen use, states she has not been using the vaginal estrogen. She notices dryness after wiping which is the bottom.  She also reports a history of frequent urinary tract infections, being managed by Dr. Annabell Key.  She reports UTIs are diagnosed with lab and treated accordingly.  She has had sexual intercourse, but not sexually active.  Burning of the vaginal area with urine touches the skin.  She reports general labial discomfort and pain as well as itching, not a particular floor or small area of pain.She denies issues with urinary leakge.        Gynecologic History No LMP recorded. Patient is postmenopausal. Contraception: post menopausal status Last Pap: not indicated Last Mammogram:  not seen on file Last Colonoscopy:  2016  Obstetric History OB History  No obstetric history on file.    Past Medical History:  Diagnosis Date   Anemia    past history during menses   Cataract    Complication of anesthesia    History of seasonal allergies    Hypothyroidism    PONV (postoperative nausea and vomiting)     Past Surgical History:  Procedure Laterality Date   Bartholins Bilateral    COLONOSCOPY WITH PROPOFOL  N/A 12/12/2015   Procedure: COLONOSCOPY WITH PROPOFOL ;  Surgeon: Garrett Kallman, MD;  Location: [facility];  Service: Endoscopy;  Laterality: N/A;   SEPTOPLASTY  Bilateral    TUBAL LIGATION     with Bartholin's cyst    Current Outpatient Medications on File Prior to Visit  Medication Sig Dispense Refill   Calcium Citrate-Vitamin D (CALCIUM + D PO) Take 2 tablets by mouth 2 (two) times daily.     hydrochlorothiazide (MICROZIDE) 12.5 MG capsule 1 tablet     levothyroxine (SYNTHROID) 75 MCG tablet Take 75 mcg by mouth daily.     simvastatin (ZOCOR) 20 MG tablet Take 20 mg by mouth at bedtime.     alendronate (FOSAMAX) 70 MG tablet Take 70 mg by mouth once a week. (Patient not taking: Reported on 03/23/2024)     calcium chloride in sodium chloride  0.9 % 50 mL cal mag zinc - 4 tablets (Patient not taking: Reported on 03/23/2024)     CALCIUM PO Take 600 mg by mouth daily. Takes 2 a day (Patient not taking: Reported on 03/23/2024)     Cholecalciferol (VITAMIN D PO) Take 1-2 tablets by mouth 2 (two) times daily. (Patient not taking: Reported on 03/23/2024)     estradiol  (ESTRACE ) 0.1 MG/GM vaginal cream Apply a pea-sized volume of estrogen cream to your vaginal opening using your index finger 2 nights a week Do not use applicator that comes with the cream (Patient not taking: Reported on 03/23/2024) 42.5 g 3   gabapentin  (NEURONTIN ) 100 MG capsule Take 1 capsule (100 mg total) by mouth at bedtime. (Patient not taking: Reported on 03/23/2024) 30 capsule 3   Influenza  vac split quadrivalent PF (FLUZONE HIGH-DOSE) 0.5 ML injection Fluzone High-Dose 2018-2019 (PF) 180 mcg/0.5 mL intramuscular syringe  TO BE ADMINISTERED BY PHARMACIST FOR IMMUNIZATION (Patient not taking: Reported on 03/23/2024)     levothyroxine (SYNTHROID, LEVOTHROID) 75 MCG tablet Take 75 mcg by mouth daily before breakfast. (Patient not taking: Reported on 03/23/2024)     simvastatin (ZOCOR) 20 MG tablet Take 20 mg by mouth daily. (Patient not taking: Reported on 03/23/2024)     VITAMIN D PO Take 1,000 mg by mouth daily. (Patient not taking: Reported on 03/23/2024)     No current facility-administered  medications on file prior to visit.    Allergies  Allergen Reactions   Codeine Nausea Only   Sulfa Antibiotics Other (See Comments)    Made her feel bad   Sulfa Antibiotics     Pt stated, "It makes me feel worse than when I took it"    Social History:  reports that she has been smoking cigarettes. She has a 44.3 pack-year smoking history. She has never used smokeless tobacco. She reports that she does not drink alcohol and does not use drugs.  Family History  Problem Relation Age of Onset   Lung disease Mother    Heart failure Father    Hypertension Brother     The following portions of the patient's history were reviewed and updated as appropriate: allergies, current medications, past family history, past medical history, past social history, past surgical history and problem list.  Review of Systems Pertinent items noted in HPI and remainder of comprehensive ROS otherwise negative.  Physical Exam:  BP (!) 143/60   Pulse 79   Ht 4\' 8"  (1.422 m)   Wt 142 lb (64.4 kg)   BMI 31.84 kg/m  Physical Exam Vitals and nursing note reviewed. Exam conducted with a chaperone present.  Constitutional:      Appearance: Normal appearance.  Pulmonary:     Effort: Pulmonary effort is normal.  Abdominal:     Palpations: Abdomen is soft.  Genitourinary:    General: Normal vulva.     Exam position: Lithotomy position.     Comments: Atrophic vaginal introitus Allodynia from 3 to 9 o'clock No sores noted on inner labia Neurological:     Mental Status: She is alert.      Assessment and Plan:   1. Genitourinary syndrome of menopause (Primary) Patient has evidence of GSM on exam, no discrete sore noted.  Per patient report, she has been using nystatin cream instead of estrogen cream; recommend use of estrogen cream. Unclear if truly had been using nystatin versus estrogen cream, as no nystatin cream seen on medication list, but patient clearly states that she has not been using this  cream.  Recommend picking up refill that previously been sent to [facility] of choice.  If no improvement with vaginal estrogen or noncompliant, can trial Osphena for GSM and see if there is improvement.  2. Encounter for screening mammogram for malignant neoplasm of breast - MM 3D SCREENING MAMMOGRAM BILATERAL BREAST; Future   Routine preventative health maintenance measures emphasized. Please refer to After Visit Summary for other counseling recommendations.   Follow-up: Return in about 3 months (around 06/22/2024).     Kiki Pelton, MD Obstetrician & Gynecologist, [facility] for [employer], [facility]

## 2024-03-27 ENCOUNTER — Telehealth (HOSPITAL_BASED_OUTPATIENT_CLINIC_OR_DEPARTMENT_OTHER): Payer: Self-pay

## 2024-04-03 ENCOUNTER — Telehealth (HOSPITAL_BASED_OUTPATIENT_CLINIC_OR_DEPARTMENT_OTHER): Payer: Self-pay

## 2024-04-06 DIAGNOSIS — E7800 Pure hypercholesterolemia, unspecified: Secondary | ICD-10-CM | POA: Diagnosis not present

## 2024-04-06 DIAGNOSIS — F419 Anxiety disorder, unspecified: Secondary | ICD-10-CM | POA: Diagnosis not present

## 2024-04-06 DIAGNOSIS — R7303 Prediabetes: Secondary | ICD-10-CM | POA: Diagnosis not present

## 2024-04-06 DIAGNOSIS — F17210 Nicotine dependence, cigarettes, uncomplicated: Secondary | ICD-10-CM | POA: Diagnosis not present

## 2024-04-06 DIAGNOSIS — M8580 Other specified disorders of bone density and structure, unspecified site: Secondary | ICD-10-CM | POA: Diagnosis not present

## 2024-04-06 DIAGNOSIS — E039 Hypothyroidism, unspecified: Secondary | ICD-10-CM | POA: Diagnosis not present

## 2024-04-06 DIAGNOSIS — I10 Essential (primary) hypertension: Secondary | ICD-10-CM | POA: Diagnosis not present

## 2024-04-06 DIAGNOSIS — Z993 Dependence on wheelchair: Secondary | ICD-10-CM | POA: Diagnosis not present

## 2024-04-16 ENCOUNTER — Other Ambulatory Visit: Payer: Self-pay | Admitting: Obstetrics and Gynecology

## 2024-04-16 ENCOUNTER — Telehealth: Payer: Self-pay

## 2024-04-16 DIAGNOSIS — N958 Other specified menopausal and perimenopausal disorders: Secondary | ICD-10-CM

## 2024-04-16 MED ORDER — ESTRADIOL 10 MCG VA TABS: 1.0000 | ORAL_TABLET | VAGINAL | 9 refills | Status: DC

## 2024-04-16 NOTE — Telephone Encounter (Signed)
 Patient called stating she was prescribed vaginal cream but it is not providing any relief. Patient states her vagina is raw. A message will be sent to the provider. Understanding was voiced. Tyera Hansley l Lyam Provencio, CMA

## 2024-05-05 DIAGNOSIS — I10 Essential (primary) hypertension: Secondary | ICD-10-CM | POA: Diagnosis not present

## 2024-05-05 DIAGNOSIS — J432 Centrilobular emphysema: Secondary | ICD-10-CM | POA: Diagnosis not present

## 2024-05-06 DIAGNOSIS — W19XXXA Unspecified fall, initial encounter: Secondary | ICD-10-CM | POA: Diagnosis not present

## 2024-05-06 DIAGNOSIS — S79912A Unspecified injury of left hip, initial encounter: Secondary | ICD-10-CM | POA: Diagnosis not present

## 2024-05-14 ENCOUNTER — Ambulatory Visit: Admitting: Obstetrics and Gynecology

## 2024-05-14 ENCOUNTER — Other Ambulatory Visit (HOSPITAL_COMMUNITY)
Admission: RE | Admit: 2024-05-14 | Discharge: 2024-05-14 | Disposition: A | Discharge status: Home / Self Care | Source: Ambulatory Visit | Attending: Obstetrics and Gynecology | Admitting: Obstetrics and Gynecology

## 2024-05-14 VITALS — BP 137/54 | HR 83 | Wt 142.0 lb

## 2024-05-14 DIAGNOSIS — R300 Dysuria: Secondary | ICD-10-CM | POA: Diagnosis not present

## 2024-05-14 DIAGNOSIS — N898 Other specified noninflammatory disorders of vagina: Secondary | ICD-10-CM | POA: Diagnosis not present

## 2024-05-14 DIAGNOSIS — N958 Other specified menopausal and perimenopausal disorders: Secondary | ICD-10-CM | POA: Diagnosis not present

## 2024-05-14 LAB — POCT URINALYSIS DIPSTICK
Blood, UA: POSITIVE
Glucose, UA: NEGATIVE
Ketones, UA: NEGATIVE
Nitrite, UA: POSITIVE
Protein, UA: NEGATIVE
Spec Grav, UA: 1.01 (ref 1.010–1.025)
Urobilinogen, UA: 0.2 U/dL
pH, UA: 7.5 (ref 5.0–8.0)

## 2024-05-14 MED ORDER — ESTRADIOL 0.1 MG/GM VA CREA: TOPICAL_CREAM | VAGINAL | 12 refills | Status: DC

## 2024-05-14 MED ORDER — AMOXICILLIN-POT CLAVULANATE 500-125 MG PO TABS: 1.0000 | ORAL_TABLET | Freq: Two times a day (BID) | ORAL | 0 refills | Status: AC

## 2024-05-14 NOTE — Progress Notes (Signed)
   ESTABLISHED GYNECOLOGY VISIT Chief Complaint  Patient presents with   Dysuria    Subjective:  Kendra Mitchell is a 78 y.o.  presenting for burning with urination.  States urine has a foul odor and has burning with urination. No fever/chills. Notes vaginal itching. No discharge. Has stopped vaginal estrogen because tablets were too expensive   Review of Systems:   Pertinent items are noted in HPI  Pertinent History Reviewed:  Reviewed past medical,surgical, social and family history.  Reviewed problem list, medications and allergies.  Objective:   Vitals:   05/14/24 1506  BP: (!) 137/54  Pulse: 83  Weight: 142 lb (64.4 kg)   Physical Examination:   General appearance - well appearing, and in no distress  Mental status - alert, oriented to person, place, and time  Psych:  normal mood and affect  Skin - warm and dry, normal color, no suspicious lesions noted  Abdomen - soft, nontender, nondistended, no masses or organomegaly  Pelvic -  VULVA: normal appearing vulva with no masses, tenderness or lesions, atrophic   VAGINA: normal appearing vagina with normal color and discharge, no lesions, atrophic  CERVIX: normal appearing cervix without discharge or lesions, no CMT  Extremities:  No swelling or varicosities noted  Chaperone present for exam  Assessment and Plan:  1. Dysuria (Primary) UA demonstrates UTI. Rx given. - Urinalysis - Urine Culture - POCT urinalysis dipstick - amoxicillin-clavulanate (AUGMENTIN) 500-125 MG tablet; Take 1 tablet by mouth 2 (two) times daily for 5 days.  Dispense: 10 tablet; Refill: 0  2. Genitourinary syndrome of menopause Reviewed atrophy and vaginal estrogen for prevention of urinary tract infections. Refills on estrace  cream given. Denies hx breast cancer or VTE - estradiol  (ESTRACE ) 0.1 MG/GM vaginal cream; Apply 1 gram per vagina three times a week  Dispense: 30 g; Refill: 12  3. Vaginal itching Swab done - Cervicovaginal  ancillary only( [place])  No follow-ups on file.  No future appointments.  Marci Setter, MD, FACOG Obstetrician & Gynecologist, [facility] for [facility], [facility]

## 2024-05-14 NOTE — Progress Notes (Signed)
 Pt presents for dysuria and malodorous urine.  Occasional vaginal itching x2-3 weeks.

## 2024-05-15 LAB — URINALYSIS

## 2024-05-18 ENCOUNTER — Ambulatory Visit: Payer: Self-pay | Admitting: Obstetrics and Gynecology

## 2024-05-18 LAB — CERVICOVAGINAL ANCILLARY ONLY
Bacterial Vaginitis (gardnerella): NEGATIVE
Candida Glabrata: NEGATIVE
Candida Vaginitis: NEGATIVE
Comment: NEGATIVE
Comment: NEGATIVE
Comment: NEGATIVE

## 2024-05-20 ENCOUNTER — Other Ambulatory Visit: Payer: Self-pay | Admitting: Obstetrics and Gynecology

## 2024-05-20 LAB — URINE CULTURE

## 2024-05-20 MED ORDER — FOSFOMYCIN TROMETHAMINE 3 G PO PACK: 3.0000 g | PACK | Freq: Once | ORAL | 0 refills | Status: AC

## 2024-05-20 NOTE — Telephone Encounter (Signed)
 Telephone call to patient. 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 Patient denies fevers, chills, systemic symptoms, feeling unwell. New rx sent for fosfomycin. Encouraged vaginal estrogen again for UTI prevention. Will have nurse visit in a couple weeks to make sure she cleared the infection. All questions answered. ER precautions reviewed.

## 2024-05-22 ENCOUNTER — Telehealth: Payer: Self-pay

## 2024-05-22 NOTE — Telephone Encounter (Signed)
 Returned call, HP pt....the patient's spouse wanted to confirm that they had the right rx that was recently sent to pharmacy; confirmed and provided [facility] HP number for future.

## 2024-06-01 DIAGNOSIS — E7800 Pure hypercholesterolemia, unspecified: Secondary | ICD-10-CM | POA: Diagnosis not present

## 2024-06-01 DIAGNOSIS — E039 Hypothyroidism, unspecified: Secondary | ICD-10-CM | POA: Diagnosis not present

## 2024-06-01 DIAGNOSIS — I10 Essential (primary) hypertension: Secondary | ICD-10-CM | POA: Diagnosis not present

## 2024-06-01 DIAGNOSIS — J432 Centrilobular emphysema: Secondary | ICD-10-CM | POA: Diagnosis not present

## 2024-06-02 NOTE — Telephone Encounter (Signed)
 LM for patient to call back.  Danna Hefty

## 2024-06-03 DIAGNOSIS — I10 Essential (primary) hypertension: Secondary | ICD-10-CM | POA: Diagnosis not present

## 2024-06-03 DIAGNOSIS — J432 Centrilobular emphysema: Secondary | ICD-10-CM | POA: Diagnosis not present

## 2024-06-03 NOTE — Telephone Encounter (Signed)
 LM for patient to call back.  Kendra Mitchell

## 2024-06-09 ENCOUNTER — Ambulatory Visit

## 2024-06-09 VITALS — BP 144/62 | HR 75 | Wt 143.1 lb

## 2024-06-09 DIAGNOSIS — N390 Urinary tract infection, site not specified: Secondary | ICD-10-CM | POA: Diagnosis not present

## 2024-06-11 NOTE — Progress Notes (Signed)
 This pt was seen for UTI I sent her urine off for a culture.

## 2024-06-11 NOTE — Progress Notes (Signed)
 Chart reviewed - agree with CMA/RN documentation.

## 2024-06-12 ENCOUNTER — Ambulatory Visit: Payer: Self-pay | Admitting: Obstetrics & Gynecology

## 2024-06-12 DIAGNOSIS — N390 Urinary tract infection, site not specified: Secondary | ICD-10-CM

## 2024-06-12 LAB — URINE CULTURE

## 2024-06-12 MED ORDER — CEFADROXIL 500 MG PO CAPS: 500.0000 mg | ORAL_CAPSULE | Freq: Two times a day (BID) | ORAL | 0 refills | Status: AC

## 2024-06-25 ENCOUNTER — Other Ambulatory Visit: Payer: Self-pay | Admitting: Urology

## 2024-06-25 DIAGNOSIS — N958 Other specified menopausal and perimenopausal disorders: Secondary | ICD-10-CM

## 2024-07-02 DIAGNOSIS — E7800 Pure hypercholesterolemia, unspecified: Secondary | ICD-10-CM | POA: Diagnosis not present

## 2024-07-02 DIAGNOSIS — J432 Centrilobular emphysema: Secondary | ICD-10-CM | POA: Diagnosis not present

## 2024-07-02 DIAGNOSIS — I10 Essential (primary) hypertension: Secondary | ICD-10-CM | POA: Diagnosis not present

## 2024-07-02 DIAGNOSIS — E039 Hypothyroidism, unspecified: Secondary | ICD-10-CM | POA: Diagnosis not present

## 2024-07-03 DIAGNOSIS — J432 Centrilobular emphysema: Secondary | ICD-10-CM | POA: Diagnosis not present

## 2024-07-03 DIAGNOSIS — I10 Essential (primary) hypertension: Secondary | ICD-10-CM | POA: Diagnosis not present

## 2024-08-02 DIAGNOSIS — E039 Hypothyroidism, unspecified: Secondary | ICD-10-CM | POA: Diagnosis not present

## 2024-08-02 DIAGNOSIS — J432 Centrilobular emphysema: Secondary | ICD-10-CM | POA: Diagnosis not present

## 2024-08-02 DIAGNOSIS — E7800 Pure hypercholesterolemia, unspecified: Secondary | ICD-10-CM | POA: Diagnosis not present

## 2024-08-02 DIAGNOSIS — I10 Essential (primary) hypertension: Secondary | ICD-10-CM | POA: Diagnosis not present

## 2024-08-11 DIAGNOSIS — T7840XA Allergy, unspecified, initial encounter: Secondary | ICD-10-CM | POA: Diagnosis not present

## 2024-09-01 DIAGNOSIS — E039 Hypothyroidism, unspecified: Secondary | ICD-10-CM | POA: Diagnosis not present

## 2024-09-01 DIAGNOSIS — I10 Essential (primary) hypertension: Secondary | ICD-10-CM | POA: Diagnosis not present

## 2024-09-01 DIAGNOSIS — J432 Centrilobular emphysema: Secondary | ICD-10-CM | POA: Diagnosis not present

## 2024-09-01 DIAGNOSIS — E7800 Pure hypercholesterolemia, unspecified: Secondary | ICD-10-CM | POA: Diagnosis not present

## 2024-09-28 DIAGNOSIS — L218 Other seborrheic dermatitis: Secondary | ICD-10-CM | POA: Diagnosis not present

## 2024-10-01 DIAGNOSIS — J432 Centrilobular emphysema: Secondary | ICD-10-CM | POA: Diagnosis not present

## 2024-10-01 DIAGNOSIS — I10 Essential (primary) hypertension: Secondary | ICD-10-CM | POA: Diagnosis not present

## 2024-10-02 DIAGNOSIS — J432 Centrilobular emphysema: Secondary | ICD-10-CM | POA: Diagnosis not present

## 2024-10-02 DIAGNOSIS — E7800 Pure hypercholesterolemia, unspecified: Secondary | ICD-10-CM | POA: Diagnosis not present

## 2024-10-02 DIAGNOSIS — I10 Essential (primary) hypertension: Secondary | ICD-10-CM | POA: Diagnosis not present

## 2024-10-02 DIAGNOSIS — E039 Hypothyroidism, unspecified: Secondary | ICD-10-CM | POA: Diagnosis not present

## 2024-10-13 DIAGNOSIS — Z6831 Body mass index (BMI) 31.0-31.9, adult: Secondary | ICD-10-CM | POA: Diagnosis not present

## 2024-10-13 DIAGNOSIS — Z0000 Encounter for general adult medical examination without abnormal findings: Secondary | ICD-10-CM | POA: Diagnosis not present

## 2024-10-13 DIAGNOSIS — Z1331 Encounter for screening for depression: Secondary | ICD-10-CM | POA: Diagnosis not present

## 2024-10-13 DIAGNOSIS — M8580 Other specified disorders of bone density and structure, unspecified site: Secondary | ICD-10-CM | POA: Diagnosis not present

## 2024-10-13 DIAGNOSIS — E039 Hypothyroidism, unspecified: Secondary | ICD-10-CM | POA: Diagnosis not present

## 2024-10-13 DIAGNOSIS — E7800 Pure hypercholesterolemia, unspecified: Secondary | ICD-10-CM | POA: Diagnosis not present

## 2024-10-13 DIAGNOSIS — Z993 Dependence on wheelchair: Secondary | ICD-10-CM | POA: Diagnosis not present

## 2024-10-13 DIAGNOSIS — I2510 Atherosclerotic heart disease of native coronary artery without angina pectoris: Secondary | ICD-10-CM | POA: Diagnosis not present

## 2024-10-13 DIAGNOSIS — F419 Anxiety disorder, unspecified: Secondary | ICD-10-CM | POA: Diagnosis not present

## 2024-10-13 DIAGNOSIS — I10 Essential (primary) hypertension: Secondary | ICD-10-CM | POA: Diagnosis not present

## 2024-10-13 DIAGNOSIS — R7303 Prediabetes: Secondary | ICD-10-CM | POA: Diagnosis not present

## 2024-10-13 DIAGNOSIS — Z1231 Encounter for screening mammogram for malignant neoplasm of breast: Secondary | ICD-10-CM | POA: Diagnosis not present

## 2024-10-13 DIAGNOSIS — F17210 Nicotine dependence, cigarettes, uncomplicated: Secondary | ICD-10-CM | POA: Diagnosis not present

## 2024-10-31 DIAGNOSIS — J432 Centrilobular emphysema: Secondary | ICD-10-CM | POA: Diagnosis not present

## 2024-10-31 DIAGNOSIS — I10 Essential (primary) hypertension: Secondary | ICD-10-CM | POA: Diagnosis not present

## 2024-11-01 DIAGNOSIS — J432 Centrilobular emphysema: Secondary | ICD-10-CM | POA: Diagnosis not present

## 2024-11-01 DIAGNOSIS — I10 Essential (primary) hypertension: Secondary | ICD-10-CM | POA: Diagnosis not present

## 2024-11-01 DIAGNOSIS — E039 Hypothyroidism, unspecified: Secondary | ICD-10-CM | POA: Diagnosis not present

## 2024-11-01 DIAGNOSIS — E7800 Pure hypercholesterolemia, unspecified: Secondary | ICD-10-CM | POA: Diagnosis not present
